# Patient Record
Sex: Male | Born: 1965 | Race: Black or African American | Hispanic: No | Marital: Married | Smoking: Never smoker
Health system: Southern US, Community
[De-identification: ages and names within clinical notes are randomized; demographics above are authoritative.]

## PROBLEM LIST (undated history)

## (undated) DIAGNOSIS — M109 Gout, unspecified: Secondary | ICD-10-CM

## (undated) DIAGNOSIS — K589 Irritable bowel syndrome without diarrhea: Secondary | ICD-10-CM

## (undated) DIAGNOSIS — K219 Gastro-esophageal reflux disease without esophagitis: Secondary | ICD-10-CM

## (undated) HISTORY — PX: SHOULDER ARTHROSCOPY: SHX128

---

## 2010-01-18 ENCOUNTER — Ambulatory Visit: Payer: Self-pay | Admitting: Internal Medicine

## 2010-01-22 ENCOUNTER — Ambulatory Visit: Payer: Self-pay | Admitting: Internal Medicine

## 2010-01-28 LAB — CONVERTED CEMR LAB
ALT: 25 units/L (ref 0–53)
AST: 25 units/L (ref 0–37)
Basophils Absolute: 0 10*3/uL (ref 0.0–0.1)
Calcium: 9.6 mg/dL (ref 8.4–10.5)
Eosinophils Relative: 1.7 % (ref 0.0–5.0)
GFR calc non Af Amer: 68.34 mL/min (ref >60)
Glucose, Bld: 92 mg/dL (ref 70–99)
HDL: 43.4 mg/dL (ref >39.00)
Lymphs Abs: 1.7 10*3/uL (ref 0.7–4.0)
MCV: 93.5 fL (ref 78.0–100.0)
Monocytes Absolute: 0.3 10*3/uL (ref 0.1–1.0)
Monocytes Relative: 5.6 % (ref 3.0–12.0)
Neutrophils Relative %: 56.3 % (ref 43.0–77.0)
PSA: 0.42 ng/mL (ref 0.10–4.00)
Platelets: 178 10*3/uL (ref 150.0–400.0)
Potassium: 4.2 meq/L (ref 3.5–5.1)
RDW: 13.1 % (ref 11.5–14.6)
Sodium: 141 meq/L (ref 135–145)
Total CHOL/HDL Ratio: 4
Triglycerides: 119 mg/dL (ref 0.0–149.0)
VLDL: 23.8 mg/dL (ref 0.0–40.0)
WBC: 4.7 10*3/uL (ref 4.5–10.5)

## 2010-08-24 NOTE — Assessment & Plan Note (Signed)
Summary: NEW TO ESTAB/CBS   Vital Signs:  Patient profile:   45 year old male Height:      73 inches Weight:      240 pounds BMI:     31.78 Temp:     98.1 degrees F oral Pulse rate:   66 / minute Resp:     16 per minute BP sitting:   140 / 90  (left arm)  Vitals Entered By: Jeremy Johann CMA (January 18, 2010 3:48 PM) CC: new to establish, Back Pain Comments -yearly --not fasting REVIEWED MED LIST, PATIENT AGREED DOSE AND INSTRUCTION CORRECT    History of Present Illness: NEW PT CPX no physical in 2 years    Preventive Screening-Counseling & Management  Alcohol-Tobacco     Smoking Status: never  Caffeine-Diet-Exercise     Does Patient Exercise: no      Drug Use:  no.    Allergies (verified): No Known Drug Allergies  Past History:  Past Medical History: h/o IBS, had a Cscope 2007 aprox  epididmal cyst , remotely (1990s)  Past Surgical History: none  Family History: DM-- no MI--no Colon ca-- no prostate ca--no Kawasaki dz? 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 CV:  Denies chest pain or discomfort, palpitations, and swelling of feet. Resp:  Denies cough and shortness of breath. GI:  Denies bloody stools, diarrhea, and nausea. GU:  Denies hematuria, urinary frequency, and urinary hesitancy. Psych:  Denies anxiety and depression.  Physical Exam  General:  alert, well-developed, and well-nourished.   Ears:  R ear normal and L ear normal.   Mouth:  good dentition and pharynx pink and moist.   Neck:  no masses, no thyromegaly, and normal carotid upstroke.   Lungs:  normal respiratory effort, no intercostal retractions, no accessory  muscle use, and normal breath sounds.   Heart:  normal rate, regular rhythm, and no murmur.   Abdomen:  soft, non-tender, no distention, no masses, no guarding, and no rigidity.   Rectal:  No external abnormalities noted. Normal sphincter tone. No rectal masses or tenderness. hemocult neg  Prostate:  Prostate gland firm and smooth, no enlargement, nodularity, tenderness, mass, asymmetry or induration. Extremities:  no pretibial edema bilaterally  Neurologic:  alert & oriented X3, strength normal in all extremities, and gait normal.   Psych:  Cognition and judgment appear intact. Alert and cooperative with normal attention span and concentration. not anxious appearing and not depressed appearing.     Impression & Recommendations:  Problem # 1:  ROUTINE GENERAL MEDICAL EXAM@HEALTH  CARE FACL (ICD-V70.0) Td 08 Cscope 2007 per patient  (w/u for IBS) labs pt is AA , start prostate ca screening q 2years, q year at age 39  diet exercise discussed   slightly  elevated BP, see instructions  Patient Instructions: 1)  came back fasting 2)  FLP CBC BMP PSA TSH AST ALT----dx V70  3)  Check your blood pressure 2 or 3 times a month . If it is more than 140/85 consistently,please let us know  4)  Please schedule a follow-up appointment in 1 year.    Tetanus/Td Immunization History:    Tetanus/Td # 1:  per pt  (  03/26/2007)   

## 2014-01-30 ENCOUNTER — Encounter: Payer: Self-pay | Admitting: Emergency Medicine

## 2014-01-30 ENCOUNTER — Emergency Department
Admission: EM | Admit: 2014-01-30 | Discharge: 2014-01-30 | Disposition: A | Discharge status: Home / Self Care | Payer: Managed Care, Other (non HMO) | Source: Home / Self Care | Attending: Emergency Medicine | Admitting: Emergency Medicine

## 2014-01-30 DIAGNOSIS — R509 Fever, unspecified: Secondary | ICD-10-CM

## 2014-01-30 DIAGNOSIS — H109 Unspecified conjunctivitis: Secondary | ICD-10-CM

## 2014-01-30 DIAGNOSIS — J069 Acute upper respiratory infection, unspecified: Secondary | ICD-10-CM

## 2014-01-30 HISTORY — DX: Gastro-esophageal reflux disease without esophagitis: K21.9

## 2014-01-30 HISTORY — DX: Irritable bowel syndrome, unspecified: K58.9

## 2014-01-30 LAB — POCT INFLUENZA A/B
INFLUENZA A, POC: NEGATIVE
INFLUENZA B, POC: NEGATIVE

## 2014-01-30 MED ORDER — POLYMYXIN B-TRIMETHOPRIM 10000-0.1 UNIT/ML-% OP SOLN: 1.0000 [drp] | Freq: Four times a day (QID) | OPHTHALMIC | Status: DC

## 2014-01-30 MED ORDER — AZITHROMYCIN 250 MG PO TABS: ORAL_TABLET | ORAL | Status: DC

## 2014-01-30 NOTE — ED Notes (Signed)
Eyes red, watery, burning, body aches, chills, sweats, cough, runny nose, congestion started Tuesday night worse yesterday and today

## 2014-01-30 NOTE — ED Provider Notes (Signed)
CSN: [id number]     Arrival date & time 01/30/14  1020 History   First MD Initiated Contact with Patient 01/30/14 1021     Chief Complaint  Patient presents with  . Eye Problem   (Consider location/radiation/quality/duration/timing/severity/associated sxs/prior Treatment) HPI Christopher Massey is a 48 y.o. male who complains of onset of cold symptoms for 4 days.  The symptoms are constant and mild-moderate in severity. + sore throat + cough No pleuritic pain No wheezing + nasal congestion + post-nasal drainage + sinus pain/pressure + chest congestion + Bilateral itchy/red eyes, with drainage that is clear.  Has been exposed to someone with pink eye recently. No hemoptysis No SOB + chills/sweats No nausea No vomiting + abdominal pain/bloating No diarrhea No skin rashes + fatigue + myalgias + headache     Past Medical History  Diagnosis Date  . IBS (irritable bowel syndrome)   . GERD (gastroesophageal reflux disease)    History reviewed. No pertinent past surgical history. No family history on file. History  Substance Use Topics  . Smoking status: Never Smoker   . Smokeless tobacco: Not on file  . Alcohol Use: No    Review of Systems  All other systems reviewed and are negative.   Allergies  Review of patient's allergies indicates no known allergies.  Home Medications   Prior to Admission medications   Medication Sig Start Date End Date Taking? Authorizing Provider  azithromycin (ZITHROMAX Z-PAK) 250 MG tablet Use as directed 01/30/14   Marlaine HindJeffrey H Modest Draeger, MD  trimethoprim-polymyxin b (POLYTRIM) ophthalmic solution Place 1 drop into both eyes every 6 (six) hours. 01/30/14   Marlaine HindJeffrey H Keilynn Marano, MD   BP 123/84  Pulse 74  Temp(Src) 98 F (36.7 C) (Oral)  Ht 6\' 2"  (1.88 m)  Wt 250 lb (113.399 kg)  BMI 32.08 kg/m2  SpO2 96% Physical Exam  Nursing note and vitals reviewed. Constitutional: He is oriented to person, place, and time. He appears well-developed and  well-nourished. He does not have a sickly appearance.  HENT:  Head: Normocephalic and atraumatic.  Right Ear: Tympanic membrane, external ear and ear canal normal.  Left Ear: Tympanic membrane, external ear and ear canal normal.  Nose: Mucosal edema and rhinorrhea present.  Mouth/Throat: Posterior oropharyngeal erythema present. No oropharyngeal exudate or posterior oropharyngeal edema.  Eyes: Pupils are equal, round, and reactive to light. Right eye exhibits no exudate. Left eye exhibits no exudate. Right conjunctiva is injected. Left conjunctiva is injected. No scleral icterus.  Neck: Neck supple.  Cardiovascular: Regular rhythm and normal heart sounds.   Pulmonary/Chest: Effort normal and breath sounds normal. No respiratory distress. He has no decreased breath sounds. He has no wheezes. He has no rhonchi.  Neurological: He is alert and oriented to person, place, and time.  Skin: Skin is warm and dry.  Psychiatric: He has a normal mood and affect. His speech is normal.    ED Course  Procedures (including critical care time) Labs Review Labs Reviewed  POCT INFLUENZA A/B    Imaging Review No results found.   MDM   1. Fever, unspecified   2. Acute upper respiratory infections of unspecified site   3. Conjunctivitis, unspecified laterality    1)  Take the prescribed antibiotic as instructed.  Rapid flu negative. 2)  Use nasal saline solution (over the counter) at least 3 times a day. 3)  Use over the counter decongestants like Zyrtec-D every 12 hours as needed to help with congestion.  If you have hypertension,  do not take medicines with sudafed.  4)  Can take tylenol every 6 hours or motrin every 8 hours for pain or fever. 5)  Follow up with your primary doctor if no improvement in 5-7 days, sooner if increasing pain, fever, or new symptoms.     Marlaine Hind, MD 01/30/14 1146

## 2014-01-31 ENCOUNTER — Telehealth: Payer: Self-pay | Admitting: Pulmonary Disease

## 2014-01-31 NOTE — Telephone Encounter (Signed)
lmomtcb x1 According to epic pt has never been seen here. He needs a consult appt

## 2014-02-03 NOTE — Telephone Encounter (Signed)
lmomtcb for pt 

## 2014-02-04 NOTE — Telephone Encounter (Signed)
Spoke with patient Disabled veteran needing 2nd opinion of sleep apnea for VA purposes Current CPAP use- compliant per patient --- followed by Dr Particia Lathersbourne Patient reports having Nasal Septum Deviation which has been followed by Dr Randa EvensEdwards. Per patient, he was informed that his deviated septum could be the cause of his sleep apnea issues.  [facility] at 2:15 Pt aware to arrive at 2pm to fill out paperwork Bring all meds to appt  .Nothing further needed.

## 2014-02-07 ENCOUNTER — Emergency Department
Admission: EM | Admit: 2014-02-07 | Discharge: 2014-02-07 | Disposition: A | Discharge status: Home / Self Care | Payer: Managed Care, Other (non HMO) | Source: Home / Self Care | Attending: Emergency Medicine | Admitting: Emergency Medicine

## 2014-02-07 ENCOUNTER — Encounter: Payer: Self-pay | Admitting: Emergency Medicine

## 2014-02-07 ENCOUNTER — Emergency Department (INDEPENDENT_AMBULATORY_CARE_PROVIDER_SITE_OTHER): Payer: Managed Care, Other (non HMO)

## 2014-02-07 DIAGNOSIS — J209 Acute bronchitis, unspecified: Secondary | ICD-10-CM

## 2014-02-07 DIAGNOSIS — R05 Cough: Secondary | ICD-10-CM

## 2014-02-07 DIAGNOSIS — J0101 Acute recurrent maxillary sinusitis: Secondary | ICD-10-CM

## 2014-02-07 DIAGNOSIS — R059 Cough, unspecified: Secondary | ICD-10-CM

## 2014-02-07 DIAGNOSIS — J0100 Acute maxillary sinusitis, unspecified: Secondary | ICD-10-CM

## 2014-02-07 MED ORDER — CEFDINIR 300 MG PO CAPS: 300.0000 mg | ORAL_CAPSULE | Freq: Two times a day (BID) | ORAL | Status: DC

## 2014-02-07 MED ORDER — PREDNISONE (PAK) 10 MG PO TABS: ORAL_TABLET | ORAL | Status: DC

## 2014-02-07 MED ORDER — IPRATROPIUM-ALBUTEROL 0.5-2.5 (3) MG/3ML IN SOLN
3.0000 mL | RESPIRATORY_TRACT | Status: AC
  Administered 2014-02-07: 3 mL via RESPIRATORY_TRACT

## 2014-02-07 MED ORDER — PROMETHAZINE-CODEINE 6.25-10 MG/5ML PO SYRP: ORAL_SOLUTION | ORAL | Status: DC

## 2014-02-07 NOTE — ED Provider Notes (Signed)
CSN: [id number]     Arrival date & time 02/07/14  1615 History   First MD Initiated Contact with Patient 02/07/14 1640     Chief Complaint  Patient presents with  . Nasal Congestion  . Cough   (Consider location/radiation/quality/duration/timing/severity/associated sxs/prior Treatment) HPI Was seen here 8 days ago on 01/30/14 for URI and he took Z-Pak as prescribed and completed. Reviewed those notes from 01/30/14.  Over the past 2-3 days, worsening symptoms of cough productive of discolored sputum, fever chills, sweats. 2 days ago had nausea and vomiting and diarrhea, 2 loose semi-formed stools, but since then vomiting and diarrhea have resolved. He has minimal nausea. He is tolerating by mouth's, but decreased appetite. Denies any blood or mucus in stool.   + chills/sweats +  Fever  +  Nasal congestion +  Discolored Post-nasal drainage No sinus pain/pressure No sore throat  +  cough + wheezing +chest congestion No hemoptysis No shortness of breath No pleuritic pain.  denies exertional chest pain  No itchy/red eyes Mild bilateral ear fullness   Mild nausea No current vomiting No abdominal pain No current diarrhea  No skin rashes +  Fatigue No myalgias No headache    Past Medical History  Diagnosis Date  . IBS (irritable bowel syndrome)   . GERD (gastroesophageal reflux disease)    History reviewed. No pertinent past surgical history. History reviewed. No pertinent family history. History  Substance Use Topics  . Smoking status: Never Smoker   . Smokeless tobacco: Not on file  . Alcohol Use: No    Review of Systems  All other systems reviewed and are negative.   Allergies  Review of patient's allergies indicates no known allergies.  Home Medications   Prior to Admission medications   Medication Sig Start Date End Date Taking? Authorizing Provider  cefdinir (OMNICEF) 300 MG capsule Take 1 capsule (300 mg total) by mouth 2 (two) times daily. X 10 days  02/07/14   Lajean Manes, MD  predniSONE (STERAPRED UNI-PAK) 10 MG tablet Take as directed for 6 days.--Take 6 on day 1, 5 on day 2, 4 on day 3, then 3 tablets on day 4, then 2 tablets on day 5, then 1 on day 6. 02/07/14   Lajean Manes, MD  promethazine-codeine [facility] WITH CODEINE) 6.25-10 MG/5ML syrup Take 1-2 teaspoons every 4-6 hours as needed for cough. May cause drowsiness. 02/07/14   Lajean Manes, MD   BP 132/86  Pulse 80  Temp(Src) 98.3 F (36.8 C) (Oral)  Resp 16  SpO2 96% Physical Exam  Nursing note and vitals reviewed. Constitutional: He is oriented to person, place, and time. He appears well-developed and well-nourished. No distress.  Appears fatigued, ill, but no acute cardiorespiratory distress. Alert, cooperative  HENT:  Head: Normocephalic and atraumatic.  Right Ear: Tympanic membrane, external ear and ear canal normal.  Left Ear: Tympanic membrane, external ear and ear canal normal.  Nose: Mucosal edema and rhinorrhea present. Right sinus exhibits maxillary sinus tenderness. Left sinus exhibits maxillary sinus tenderness.  Mouth/Throat: Oropharynx is clear and moist. No oral lesions. No oropharyngeal exudate.  Eyes: Conjunctivae are normal. Right eye exhibits no discharge. Left eye exhibits no discharge. No scleral icterus.  Neck: Neck supple.  Cardiovascular: Normal rate, regular rhythm and normal heart sounds.  Exam reveals no gallop and no friction rub.   No murmur heard. Pulmonary/Chest: Effort normal. He has wheezes. He has rhonchi.  Diffuse rhonchi. Mild late expiratory wheezes bilaterally. Bibasilar rales which clear after  coughing.  Abdominal: Soft. There is no tenderness.  Musculoskeletal: Normal range of motion. He exhibits no edema and no tenderness.  Lymphadenopathy:    He has no cervical adenopathy.  Neurological: He is alert and oriented to person, place, and time. No cranial nerve deficit.  Skin: Skin is warm and dry. No rash noted. He is not  diaphoretic.  Psychiatric: He has a normal mood and affect.    ED Course  Procedures (including critical care time) Labs Review Labs Reviewed - No data to display  Imaging Review Dg Chest 2 View  02/07/2014   CLINICAL DATA:  Two-day history of cough  EXAM: CHEST  2 VIEW  COMPARISON:  None.  FINDINGS: The lungs are well-expanded and clear. The heart and mediastinal structures are normal. There is no pleural effusion. The bony thorax is unremarkable.  IMPRESSION: There is no acute cardiopulmonary abnormality.   Electronically Signed   By: David  Swaziland[place]   On: 02/07/2014 17:11     MDM   1. Acute bronchitis with bronchospasm   2. Acute recurrent maxillary sinusitis    Treatment options discussed, as well as risks, benefits, alternatives. Patient voiced understanding and agreement with the following plans:  Chest x-ray done today, shows no acute abnormality. No infiltrate. DuoNeb nebulizer treatment given. Wheezing improved somewhat.  He declined IM Rocephin or IM steroids at this time. Omnicef prescribed for antibiotic coverage.  He declined Augmentin because of possible GI upset and history of IBS.  Phenergan with codeine cough syrup as needed for severe cough, but precautions discussed. Prednisone 10 mg-6 day Dosepak  Follow-up with your primary care doctor in 5-7 days if not improving, or sooner if symptoms become worse. Precautions discussed. Red flags discussed. Questions invited and answered. Patient voiced understanding and agreement.  Lajean Manesavid Massey, MD 02/07/14 [phone]

## 2014-02-07 NOTE — ED Notes (Signed)
Reports no improvement of congestion and cough from original evaluation 01/30/2014.

## 2014-03-11 ENCOUNTER — Institutional Professional Consult (permissible substitution): Payer: Managed Care, Other (non HMO) | Admitting: Pulmonary Disease

## 2015-08-23 ENCOUNTER — Encounter: Payer: Self-pay | Admitting: Emergency Medicine

## 2015-08-23 ENCOUNTER — Emergency Department (INDEPENDENT_AMBULATORY_CARE_PROVIDER_SITE_OTHER): Payer: Managed Care, Other (non HMO)

## 2015-08-23 ENCOUNTER — Emergency Department
Admission: EM | Admit: 2015-08-23 | Discharge: 2015-08-23 | Disposition: A | Discharge status: Home / Self Care | Payer: Managed Care, Other (non HMO) | Source: Home / Self Care | Attending: Family Medicine | Admitting: Family Medicine

## 2015-08-23 DIAGNOSIS — R221 Localized swelling, mass and lump, neck: Secondary | ICD-10-CM

## 2015-08-23 NOTE — ED Notes (Signed)
Patient presents to [facility] with C/O throat irritation times two days he feels as if something is lodged in the throat he denies problems eating or drinking.

## 2015-08-23 NOTE — ED Provider Notes (Signed)
CSN: [id number]     Arrival date & time 08/23/15  1128 History   First MD Initiated Contact with Patient 08/23/15 1136     Chief Complaint  Patient presents with  . Sore Throat   (Consider location/radiation/quality/duration/timing/severity/associated sxs/prior Treatment) HPI Pt is a 50yo male presenting to [facility] with c/o throat irritation for about 2 days. Pt states it feels like a lump is in his throat but denies difficulty breathing or swallowing. He also denies throat pain. He has been able to eat and drink but the sensation remains. He has hx of acid reflux but only takes medication as needed and has not felt any acid reflux recently. Denies fever, chills, n/v/d. Denies cough or congestion.   Past Medical History  Diagnosis Date  . IBS (irritable bowel syndrome)   . GERD (gastroesophageal reflux disease)    History reviewed. No pertinent past surgical history. History reviewed. No pertinent family history. Social History  Substance Use Topics  . Smoking status: Never Smoker   . Smokeless tobacco: None  . Alcohol Use: No    Review of Systems  Constitutional: Negative for fever and chills.  HENT: Positive for sore throat ( "sensation of something lodged in throat."). Negative for congestion, ear pain, trouble swallowing and voice change.   Respiratory: Negative for cough and shortness of breath.   Cardiovascular: Negative for chest pain and palpitations.  Gastrointestinal: Negative for nausea, vomiting, abdominal pain and diarrhea.  Musculoskeletal: Negative for myalgias, back pain and arthralgias.  Skin: Negative for rash.    Allergies  Review of patient's allergies indicates no known allergies.  Home Medications   Prior to Admission medications   Medication Sig Start Date End Date Taking? Authorizing Provider  cefdinir (OMNICEF) 300 MG capsule Take 1 capsule (300 mg total) by mouth 2 (two) times daily. X 10 days 02/07/14   Lajean Manes, MD  predniSONE (STERAPRED UNI-PAK)  10 MG tablet Take as directed for 6 days.--Take 6 on day 1, 5 on day 2, 4 on day 3, then 3 tablets on day 4, then 2 tablets on day 5, then 1 on day 6. 02/07/14   Lajean Manes, MD  promethazine-codeine [facility] WITH CODEINE) 6.25-10 MG/5ML syrup Take 1-2 teaspoons every 4-6 hours as needed for cough. May cause drowsiness. 02/07/14   Lajean Manes, MD   Meds Ordered and Administered this Visit  Medications - No data to display  Pulse 75  Temp(Src) 98 F (36.7 C) (Oral)  Resp 16  Ht  (1.854 m)  Wt 251 lb (113.853 kg)  BMI 33.12 kg/m2  SpO2 96% No data found.   Physical Exam  Constitutional: He appears well-developed and well-nourished.  HENT:  Head: Normocephalic and atraumatic.  Right Ear: Hearing, tympanic membrane, external ear and ear canal normal.  Left Ear: Hearing, tympanic membrane, external ear and ear canal normal.  Nose: Nose normal.  Mouth/Throat: Uvula is midline, oropharynx is clear and moist and mucous membranes are normal.  Eyes: Conjunctivae are normal. No scleral icterus.  Neck: Normal range of motion. Neck supple. No JVD present. No tracheal deviation present. No thyromegaly present.  Cardiovascular: Normal rate, regular rhythm and normal heart sounds.   Pulmonary/Chest: Effort normal and breath sounds normal. No stridor. No respiratory distress. He has no wheezes. He has no rales. He exhibits no tenderness.  Abdominal: Soft. He exhibits no distension. There is no tenderness.  Musculoskeletal: Normal range of motion.  Lymphadenopathy:    He has no cervical adenopathy.  Neurological:  He is alert.  Skin: Skin is warm and dry.  Nursing note and vitals reviewed.   ED Course  Procedures (including critical care time)  Labs Review Labs Reviewed - No data to display  Imaging Review Dg Neck Soft Tissue  08/23/2015  CLINICAL DATA:  Sore throat for 2 days, possible retained foreign body EXAM: NECK SOFT TISSUES - 1+ VIEW COMPARISON:  None. FINDINGS: There is no  evidence of retropharyngeal soft tissue swelling or epiglottic enlargement. The cervical airway is unremarkable and no radio-opaque foreign body identified. Mild degenerative change of the cervical spine is seen. IMPRESSION: No acute abnormality noted. Electronically Signed   By: Alcide Clever M.D.   On: 08/23/2015 12:19       MDM   1. Sensation of lump in throat    Pt c/o sensation of something in his throat but denies difficulty breathing or swallowing. Normal exam.   Plain films: no acute abnormality noted.  Consulted with radiology about possible mass, likely shadowing based on pt's positioning.   Recommends endoscopy as CT scan would likely not be as beneficial w/o specific diagnosis or physical exam findings.  Reassured pt of normal imaging. Encouraged to f/u with PCP this week to discuss further evaluation and possible laryngoscopy/endoscopy. Patient verbalized understanding and agreement with treatment plan.    Junius Finner, PA-C 08/23/15 [phone]

## 2017-07-30 ENCOUNTER — Encounter (HOSPITAL_BASED_OUTPATIENT_CLINIC_OR_DEPARTMENT_OTHER): Payer: Self-pay | Admitting: Emergency Medicine

## 2017-07-30 ENCOUNTER — Emergency Department (HOSPITAL_BASED_OUTPATIENT_CLINIC_OR_DEPARTMENT_OTHER)
Admission: EM | Admit: 2017-07-30 | Discharge: 2017-07-30 | Disposition: A | Discharge status: Home / Self Care | Payer: Commercial Managed Care - PPO | Attending: Emergency Medicine | Admitting: Emergency Medicine

## 2017-07-30 ENCOUNTER — Other Ambulatory Visit: Payer: Self-pay

## 2017-07-30 DIAGNOSIS — Z79899 Other long term (current) drug therapy: Secondary | ICD-10-CM | POA: Diagnosis not present

## 2017-07-30 DIAGNOSIS — R509 Fever, unspecified: Secondary | ICD-10-CM | POA: Diagnosis not present

## 2017-07-30 DIAGNOSIS — R69 Illness, unspecified: Secondary | ICD-10-CM

## 2017-07-30 DIAGNOSIS — R51 Headache: Secondary | ICD-10-CM | POA: Insufficient documentation

## 2017-07-30 DIAGNOSIS — M791 Myalgia, unspecified site: Secondary | ICD-10-CM | POA: Insufficient documentation

## 2017-07-30 DIAGNOSIS — J111 Influenza due to unidentified influenza virus with other respiratory manifestations: Secondary | ICD-10-CM

## 2017-07-30 HISTORY — DX: Gout, unspecified: M10.9

## 2017-07-30 MED ORDER — NAPROXEN 250 MG PO TABS
500.0000 mg | ORAL_TABLET | Freq: Once | ORAL | Status: AC
  Administered 2017-07-30: 500 mg via ORAL
  Filled 2017-07-30: qty 2

## 2017-07-30 MED ORDER — ACETAMINOPHEN ER 650 MG PO TBCR: 650.0000 mg | EXTENDED_RELEASE_TABLET | Freq: Three times a day (TID) | ORAL | 0 refills | Status: DC | PRN

## 2017-07-30 MED ORDER — IBUPROFEN 600 MG PO TABS: 600.0000 mg | ORAL_TABLET | Freq: Four times a day (QID) | ORAL | 0 refills | Status: DC | PRN

## 2017-07-30 NOTE — ED Provider Notes (Signed)
[facility] EMERGENCY DEPARTMENT Provider Note   CSN: [id number] Arrival date & time: 07/30/17  [id number]     History   Chief Complaint Chief Complaint  Patient presents with  . Flu like symptoms    HPI Christopher Massey is a 52 y.o. male.  HPI 52 year old male with history of IBS, gout comes in with chief complaint of flulike illness.  Patient started having upper respiratory infection-like symptoms about a week ago.  Over time patient developed more malaise, chills, body aches, headaches and fevers. Patient was seen by his primary care doctor, and started on Tamiflu, however, pt started having nausea and emesis and therefore Tamiflu was discontinued.  Patient was feeling a little better on Friday, however his symptoms started getting worse again yesterday.  Patient woke up today feeling significantly weak, with chills and body aches therefore decided to come to the ER.  Review of system is positive for moderate headache.  There is no associated neck stiffness or neck pain, or any confusion, focal neurologic symptoms, seizures.   Past Medical History:  Diagnosis Date  . GERD (gastroesophageal reflux disease)   . Gout   . IBS (irritable bowel syndrome)     There are no active problems to display for this patient.   Past Surgical History:  Procedure Laterality Date  . SHOULDER ARTHROSCOPY Bilateral        Home Medications    Prior to Admission medications   Medication Sig Start Date End Date Taking? Authorizing Provider  omeprazole (PRILOSEC) 20 MG capsule Take 20 mg by mouth daily.   Yes [provider]  acetaminophen (TYLENOL 8 HOUR) 650 MG CR tablet Take 1 tablet (650 mg total) by mouth every 8 (eight) hours as needed for pain. 07/30/17   Derwood KaplanNanavati, Romona Murdy, MD  cefdinir (OMNICEF) 300 MG capsule Take 1 capsule (300 mg total) by mouth 2 (two) times daily. X 10 days 02/07/14   Lajean ManesMassey, David, MD  ibuprofen (ADVIL,MOTRIN) 600 MG tablet Take 1 tablet (600 mg total) by  mouth every 6 (six) hours as needed. 07/30/17   Derwood KaplanNanavati, Kayleeann Huxford, MD  predniSONE (STERAPRED UNI-PAK) 10 MG tablet Take as directed for 6 days.--Take 6 on day 1, 5 on day 2, 4 on day 3, then 3 tablets on day 4, then 2 tablets on day 5, then 1 on day 6. 02/07/14   Lajean ManesMassey, David, MD  promethazine-codeine [facility](PHENERGAN WITH CODEINE) 6.25-10 MG/5ML syrup Take 1-2 teaspoons every 4-6 hours as needed for cough. May cause drowsiness. 02/07/14   Lajean ManesMassey, David, MD    Family History No family history on file.  Social History Social History   Tobacco Use  . Smoking status: Never Smoker  . Smokeless tobacco: Never Used  Substance Use Topics  . Alcohol use: No  . Drug use: Not on file     Allergies   Patient has no known allergies.   Review of Systems Review of Systems  Constitutional: Positive for activity change.  HENT: Positive for congestion, rhinorrhea and sore throat. Negative for trouble swallowing.   Eyes: Negative for photophobia and visual disturbance.  Respiratory: Negative for shortness of breath.   Cardiovascular: Negative for chest pain.  Gastrointestinal: Negative for vomiting.  Musculoskeletal: Positive for myalgias.  Allergic/Immunologic: Negative for immunocompromised state.  All other systems reviewed and are negative.    Physical Exam Updated Vital Signs BP (!) 153/102 (BP Location: Left Arm)   Pulse 78   Temp 99.6 F (37.6 C) (Oral)   Resp 20  Ht 6\' 1"  (1.854 m)   Wt 110.2 kg (243 lb)   SpO2 99%   BMI 32.06 kg/m   Physical Exam  Constitutional: He is oriented to person, place, and time. He appears well-developed.  HENT:  Head: Atraumatic.  Mouth/Throat: Oropharynx is clear and moist. No oropharyngeal exudate.  Eyes: EOM are normal. Pupils are equal, round, and reactive to light. No scleral icterus.  Neck: Neck supple.  No meningismus  Cardiovascular: Normal rate.  Pulmonary/Chest: Effort normal. No respiratory distress. He has no wheezes.  Lymphadenopathy:     He has cervical adenopathy.  Neurological: He is alert and oriented to person, place, and time.  Skin: Skin is warm.  Nursing note and vitals reviewed.    ED Treatments / Results  Labs (all labs ordered are listed, but only abnormal results are displayed) Labs Reviewed - No data to display  EKG  EKG Interpretation None       Radiology No results found.  Procedures Procedures (including critical care time)  Medications Ordered in ED Medications  naproxen (NAPROSYN) tablet 500 mg (not administered)     Initial Impression / Assessment and Plan / ED Course  I have reviewed the triage vital signs and the nursing notes.  Pertinent labs & imaging results that were available during my care of the patient were reviewed by me and considered in my medical decision making (see chart for details).    52 year old healthy male comes in with chief complaint of upper respiratory infection-like symptoms, with associated body aches, chills, malaise.  Patient had a recent negative flu test.  He reports that his symptoms have been waxing and waning, this morning they have been the worst.  On my exam there is no focal finding.  Specifically the lung exam is normal, neurologic exam is normal, there is no nuchal rigidity, and patient does not appear toxic.  It appears that patient has reduced his p.o. intake, we advised him that he needs to push fluids while he is recovering from his illness.  Patient has been taking ibuprofen as needed, we have asked him to now take ibuprofen every 6 hours, with Tylenol in between.  It appears that when patient takes ibuprofen, his pain gets better and is overall energy level also goes up, which is reassuring.  We will treat patient has influenza-like illness, with symptomatic relief.  Strict return precautions have been discussed.  Final Clinical Impressions(s) / ED Diagnoses   Final diagnoses:  Influenza-like illness    ED Discharge Orders         Ordered    ibuprofen (ADVIL,MOTRIN) 600 MG tablet  Every 6 hours PRN     07/30/17 1112    acetaminophen (TYLENOL 8 HOUR) 650 MG CR tablet  Every 8 hours PRN     07/30/17 1112       Derwood Kaplan, MD 07/30/17 1129

## 2017-07-30 NOTE — Discharge Instructions (Signed)
We think what you have is a viral syndrome - the treatment for which is symptomatic relief only, and your body will fight the infection off in a few days. We are prescribing you some meds for pain and fevers. Please make sure that you hydrate well. See your primary care doctor in 1 week if the symptoms dont improve.  Please return to the ER if your symptoms worsen; there is inability to keep any medications down, confusion, severe headaches etc.. Otherwise see the outpatient doctor as requested.

## 2017-07-30 NOTE — ED Triage Notes (Signed)
Pt with flu like symptoms x 1 week. Seen by PCP and given tamiflu and tessalon. Pt states tamiflu made him sick to his stomach. Pt feeling worse today.

## 2021-03-02 ENCOUNTER — Emergency Department (HOSPITAL_BASED_OUTPATIENT_CLINIC_OR_DEPARTMENT_OTHER): Payer: Managed Care, Other (non HMO)

## 2021-03-02 ENCOUNTER — Emergency Department (HOSPITAL_BASED_OUTPATIENT_CLINIC_OR_DEPARTMENT_OTHER)
Admission: EM | Admit: 2021-03-02 | Discharge: 2021-03-02 | Disposition: A | Discharge status: Home / Self Care | Payer: Managed Care, Other (non HMO) | Attending: Emergency Medicine | Admitting: Emergency Medicine

## 2021-03-02 ENCOUNTER — Other Ambulatory Visit: Payer: Self-pay

## 2021-03-02 ENCOUNTER — Encounter (HOSPITAL_BASED_OUTPATIENT_CLINIC_OR_DEPARTMENT_OTHER): Payer: Self-pay | Admitting: *Deleted

## 2021-03-02 DIAGNOSIS — R1032 Left lower quadrant pain: Secondary | ICD-10-CM

## 2021-03-02 DIAGNOSIS — R1110 Vomiting, unspecified: Secondary | ICD-10-CM | POA: Diagnosis not present

## 2021-03-02 DIAGNOSIS — R197 Diarrhea, unspecified: Secondary | ICD-10-CM | POA: Diagnosis not present

## 2021-03-02 LAB — COMPREHENSIVE METABOLIC PANEL
ALT: 25 U/L (ref 0–44)
AST: 22 U/L (ref 15–41)
Albumin: 4.9 g/dL (ref 3.5–5.0)
Alkaline Phosphatase: 54 U/L (ref 38–126)
Anion gap: 10 (ref 5–15)
BUN: 12 mg/dL (ref 6–20)
CO2: 30 mmol/L (ref 22–32)
Calcium: 9.9 mg/dL (ref 8.9–10.3)
Chloride: 98 mmol/L (ref 98–111)
Creatinine, Ser: 1.13 mg/dL (ref 0.61–1.24)
GFR, Estimated: >60 mL/min (ref >60)
Glucose, Bld: 101 mg/dL — ABNORMAL HIGH (ref 70–99)
Potassium: 4 mmol/L (ref 3.5–5.1)
Sodium: 138 mmol/L (ref 135–145)
Total Bilirubin: 0.7 mg/dL (ref 0.3–1.2)
Total Protein: 8.3 g/dL — ABNORMAL HIGH (ref 6.5–8.1)

## 2021-03-02 LAB — URINALYSIS, ROUTINE W REFLEX MICROSCOPIC
Bilirubin Urine: NEGATIVE
Glucose, UA: NEGATIVE mg/dL
Ketones, ur: NEGATIVE mg/dL
Leukocytes,Ua: NEGATIVE
Nitrite: NEGATIVE
Protein, ur: NEGATIVE mg/dL
Specific Gravity, Urine: 1.025 (ref 1.005–1.030)
pH: 6 (ref 5.0–8.0)

## 2021-03-02 LAB — URINALYSIS, MICROSCOPIC (REFLEX)

## 2021-03-02 LAB — LIPASE, BLOOD: Lipase: 32 U/L (ref 11–51)

## 2021-03-02 LAB — CBC
HCT: 52.2 % — ABNORMAL HIGH (ref 39.0–52.0)
Hemoglobin: 17.7 g/dL — ABNORMAL HIGH (ref 13.0–17.0)
MCH: 30.8 pg (ref 26.0–34.0)
MCHC: 33.9 g/dL (ref 30.0–36.0)
MCV: 90.9 fL (ref 80.0–100.0)
Platelets: 199 10*3/uL (ref 150–400)
RBC: 5.74 MIL/uL (ref 4.22–5.81)
RDW: 13.6 % (ref 11.5–15.5)
WBC: 8.1 10*3/uL (ref 4.0–10.5)
nRBC: 0 % (ref 0.0–0.2)

## 2021-03-02 MED ORDER — ONDANSETRON HCL 4 MG/2ML IJ SOLN
4.0000 mg | Freq: Once | INTRAMUSCULAR | Status: AC
  Administered 2021-03-02: 4 mg via INTRAVENOUS
  Filled 2021-03-02: qty 2

## 2021-03-02 MED ORDER — IOHEXOL 300 MG/ML  SOLN
100.0000 mL | Freq: Once | INTRAMUSCULAR | Status: AC | PRN
  Administered 2021-03-02: 100 mL via INTRAVENOUS

## 2021-03-02 MED ORDER — MORPHINE SULFATE (PF) 4 MG/ML IV SOLN
4.0000 mg | Freq: Once | INTRAVENOUS | Status: AC
  Administered 2021-03-02: 4 mg via INTRAVENOUS
  Filled 2021-03-02: qty 1

## 2021-03-02 MED ORDER — HYDROCODONE-ACETAMINOPHEN 5-325 MG PO TABS: 1.0000 | ORAL_TABLET | ORAL | 0 refills | Status: DC | PRN

## 2021-03-02 MED ORDER — SODIUM CHLORIDE 0.9 % IV BOLUS
1000.0000 mL | Freq: Once | INTRAVENOUS | Status: AC
  Administered 2021-03-02: 1000 mL via INTRAVENOUS

## 2021-03-02 MED ORDER — ONDANSETRON 4 MG PO TBDP: 4.0000 mg | ORAL_TABLET | Freq: Three times a day (TID) | ORAL | 0 refills | Status: DC | PRN

## 2021-03-02 NOTE — ED Provider Notes (Signed)
[facility] EMERGENCY DEPARTMENT Provider Note   CSN: [id number] Arrival date & time: 03/02/21  1627     History No chief complaint on file.   Christopher Massey is a 55 y.o. male.  Christopher Massey is a 55 yo male with a PMH of IBS who presents today with abdominal pain, vomiting, and diarrhea. He states he woke up at 3am to urinate, then ate cake and ice cream and tried to fall back asleep. He then had sudden onset of sharp and cramping abdominal pain located across his entire abdomen. He had 3 episodes of diarrhea, along with some vomiting. He noted no blood in his stool at this time. He felt better until around 9am, when he had more cramping and then had multiple episodes of bowel movements where he passed "globs" with no fecal matter, although he did note some blood in these. He has been attempting to drink pedialyte, but has been unable to keep down solid foods and most liquids secondary to abdominal pain. He denies fevers. He notes that he has had previous episodes like this in 1998 and 2006, which led to him having an IBD workup (with colonoscopy and other imaging studies). He denies recent travel, antibiotic use, or contact with sick persons.       Past Medical History:  Diagnosis Date   GERD (gastroesophageal reflux disease)    Gout    IBS (irritable bowel syndrome)     There are no problems to display for this patient.   Past Surgical History:  Procedure Laterality Date   SHOULDER ARTHROSCOPY Bilateral        No family history on file.  Social History   Tobacco Use   Smoking status: Never   Smokeless tobacco: Never  Vaping Use   Vaping Use: Never used  Substance Use Topics   Alcohol use: No   Drug use: Never    Home Medications Prior to Admission medications   Medication Sig Start Date End Date Taking? Authorizing Provider  HYDROcodone-acetaminophen (NORCO/VICODIN) 5-325 MG tablet Take 1 tablet by mouth every 4 (four) hours as needed. 03/02/21  Yes Jeannie Fend, PA-C  ondansetron (ZOFRAN ODT) 4 MG disintegrating tablet Take 1 tablet (4 mg total) by mouth every 8 (eight) hours as needed for nausea or vomiting. 03/02/21  Yes Jeannie Fend, PA-C  acetaminophen (TYLENOL 8 HOUR) 650 MG CR tablet Take 1 tablet (650 mg total) by mouth every 8 (eight) hours as needed for pain. 07/30/17   Derwood Kaplan, MD  allopurinol (ZYLOPRIM) 100 MG tablet Take by mouth.    [provider]  cefdinir (OMNICEF) 300 MG capsule Take 1 capsule (300 mg total) by mouth 2 (two) times daily. X 10 days 02/07/14   Lajean Manes, MD  colchicine 0.6 MG tablet Take 2 tablets by mouth once today then 1 tablet by mouth 1 hour later, then take 1 tablet by mouth 3 times daily for 10 days. 05/20/19   [provider]  ibuprofen (ADVIL,MOTRIN) 600 MG tablet Take 1 tablet (600 mg total) by mouth every 6 (six) hours as needed. 07/30/17   Derwood Kaplan, MD  omeprazole (PRILOSEC) 20 MG capsule Take 20 mg by mouth daily.    [provider]  omeprazole (PRILOSEC) 20 MG capsule Take 2 capsules by mouth daily. 12/13/11   [provider]  predniSONE (STERAPRED UNI-PAK) 10 MG tablet Take as directed for 6 days.--Take 6 on day 1, 5 on day 2, 4 on day 3,  then 3 tablets on day 4, then 2 tablets on day 5, then 1 on day 6. 02/07/14   Lajean Manes, MD  promethazine-codeine [facility] WITH CODEINE) 6.25-10 MG/5ML syrup Take 1-2 teaspoons every 4-6 hours as needed for cough. May cause drowsiness. 02/07/14   Lajean Manes, MD    Allergies    Patient has no known allergies.  Review of Systems   Review of Systems  Constitutional:  Negative for chills, diaphoresis and fever.  Respiratory:  Negative for shortness of breath.   Cardiovascular:  Negative for chest pain.  Gastrointestinal:  Positive for abdominal pain, blood in stool and diarrhea.  Genitourinary:  Negative for dysuria and frequency.  Musculoskeletal:  Negative for arthralgias and myalgias.  Skin:  Negative for  rash and wound.  Allergic/Immunologic: Negative for immunocompromised state.  Neurological:  Negative for weakness and light-headedness.  Hematological:  Does not bruise/bleed easily.  Psychiatric/Behavioral:  Negative for confusion.   All other systems reviewed and are negative.  Physical Exam Updated Vital Signs BP (!) 157/97   Pulse 74   Temp 98.3 F (36.8 C) (Oral)   Resp 16   Ht 6\' 1"  (1.854 m)   Wt 109.8 kg   SpO2 100%   BMI 31.94 kg/m   Physical Exam Vitals and nursing note reviewed.  Constitutional:      General: He is not in acute distress.    Appearance: He is well-developed. He is not diaphoretic.  HENT:     Head: Normocephalic and atraumatic.  Cardiovascular:     Rate and Rhythm: Normal rate and regular rhythm.     Heart sounds: Normal heart sounds.  Pulmonary:     Effort: Pulmonary effort is normal.  Abdominal:     Palpations: Abdomen is soft.     Tenderness: There is abdominal tenderness in the left lower quadrant.  Skin:    General: Skin is warm and dry.     Findings: No erythema or rash.  Neurological:     Mental Status: He is alert and oriented to person, place, and time.  Psychiatric:        Behavior: Behavior normal.    ED Results / Procedures / Treatments   Labs (all labs ordered are listed, but only abnormal results are displayed) Labs Reviewed  COMPREHENSIVE METABOLIC PANEL - Abnormal; Notable for the following components:      Result Value   Glucose, Bld 101 (*)    Total Protein 8.3 (*)    All other components within normal limits  CBC - Abnormal; Notable for the following components:   Hemoglobin 17.7 (*)    HCT 52.2 (*)    All other components within normal limits  URINALYSIS, ROUTINE W REFLEX MICROSCOPIC - Abnormal; Notable for the following components:   Hgb urine dipstick TRACE (*)    All other components within normal limits  URINALYSIS, MICROSCOPIC (REFLEX) - Abnormal; Notable for the following components:   Bacteria, UA FEW  (*)    All other components within normal limits  LIPASE, BLOOD    EKG None  Radiology CT Abdomen Pelvis W Contrast  Result Date: 03/02/2021 CLINICAL DATA:  Abdomen pain EXAM: CT ABDOMEN AND PELVIS WITH CONTRAST TECHNIQUE: Multidetector CT imaging of the abdomen and pelvis was performed using the standard protocol following bolus administration of intravenous contrast. CONTRAST:  05/02/2021 OMNIPAQUE IOHEXOL 300 MG/ML  SOLN COMPARISON:  None. FINDINGS: Lower chest: Lung bases demonstrate atelectasis at the bases. No acute consolidation. Normal cardiomediastinal silhouette. Hepatobiliary: No focal  liver abnormality is seen. No gallstones, gallbladder wall thickening, or biliary dilatation. Pancreas: Unremarkable. No pancreatic ductal dilatation or surrounding inflammatory changes. Spleen: Normal in size without focal abnormality. Adrenals/Urinary Tract: Adrenal glands are normal. No hydronephrosis. Punctate nonobstructing stone lower pole left kidney. The bladder is unremarkable. Stomach/Bowel: Stomach is within normal limits. Appendix appears normal. No evidence of bowel wall thickening, distention, or inflammatory changes. Vascular/Lymphatic: No significant vascular findings are present. No enlarged abdominal or pelvic lymph nodes. Reproductive: Enlarged prostate Other: Negative for pelvic effusion or free air Musculoskeletal: No acute or significant osseous findings. IMPRESSION: 1. No CT evidence for acute intra-abdominal or pelvic abnormality. 2. Punctate nonobstructing stone in the left kidney. Electronically Signed   By: Jasmine Pang M.D.   On: 03/02/2021 20:45    Procedures Procedures   Medications Ordered in ED Medications  sodium chloride 0.9 % bolus 1,000 mL (1,000 mLs Intravenous New Bag/Given 03/02/21 2003)  ondansetron [facility]) injection 4 mg (4 mg Intravenous Given 03/02/21 2007)  morphine 4 MG/ML injection 4 mg (4 mg Intravenous Given 03/02/21 2008)  iohexol (OMNIPAQUE) 300 MG/ML solution 100  mL (100 mLs Intravenous Contrast Given 03/02/21 2023)    ED Course  I have reviewed the triage vital signs and the nursing notes.  Pertinent labs & imaging results that were available during my care of the patient were reviewed by me and considered in my medical decision making (see chart for details).  Clinical Course as of 03/02/21 2117  Tue Mar 02, 2021  [id number] 55 year old male with complaint of left lower quadrant abdominal pain with loose bloody stools and vomiting. Found to have tenderness in the left lower quadrant. CT abdomen pelvis with contrast shows a punctate renal stone otherwise unremarkable.  Review of labs, CBC with normal white blood cell count, hemoglobin 17.7.  CMP without significant changes, BNP normal at 12, normal creatinine at 1.13.  Lipase within normal notes.  Urinalysis with trace hemoglobin, no evidence of infection. Blood pressure is mildly elevated otherwise vitals reassuring, O2 sat 100% on room air.  Patient was given IV fluids, morphine, Zofran with improvement in his pain.  Discussed results with patient and wife, plan is for follow-up with GI, referral given.  Given prescription for Norco and Zofran with return to ER precautions. [LM]    Clinical Course User Index [LM] Alden Hipp   MDM Rules/Calculators/A&P                           Final Clinical Impression(s) / ED Diagnoses Final diagnoses:  Left lower quadrant abdominal pain  Diarrhea, unspecified type    Rx / DC Orders ED Discharge Orders          Ordered    HYDROcodone-acetaminophen (NORCO/VICODIN) 5-325 MG tablet  Every 4 hours PRN        03/02/21 2115    ondansetron (ZOFRAN ODT) 4 MG disintegrating tablet  Every 8 hours PRN        03/02/21 2115             Jeannie Fend, PA-C 03/02/21 2117    Jacalyn Lefevre, MD 03/02/21 2304

## 2021-03-02 NOTE — Discharge Instructions (Addendum)
Follow-up with GI, call in the morning to schedule an appointment.  Return to the emergency room for worsening or concerning symptoms. Take Norco as needed as prescribed for pain.  Take Zofran as needed as prescribed for nausea and vomiting.

## 2021-03-02 NOTE — ED Triage Notes (Signed)
Abdominal pain woke his at 3am. He had 4 BM's within an hour. Hx of IBS. After getting to work he started having abdominal cramps followed by bright red rectal bleeding several times.

## 2021-11-10 IMAGING — CT CT ABD-PELV W/ CM
2 of 5 series · 16 of 46 positions shown, 18 images · IV contrast (Omnipaque)
Comparison: None.

CLINICAL DATA: Abdomen pain

EXAM:
CT ABDOMEN AND PELVIS WITH CONTRAST
TECHNIQUE: Multidetector CT imaging of the abdomen and pelvis was performed
using the standard protocol following bolus administration of
intravenous contrast.
CONTRAST:  100mL OMNIPAQUE IOHEXOL 300 MG/ML  SOLN

[Series 2: axial st · axial · 0.97mm/px · z∈[-586,-141]mm · 13 of 99 slices shown, 15 images]
[im 5/99  soft-tissue]
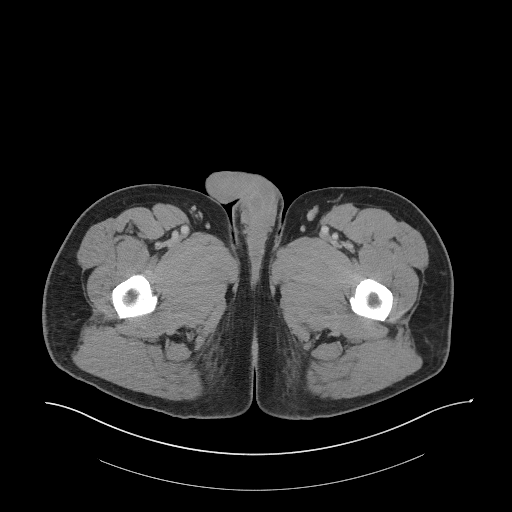
[im 5/99  bone]
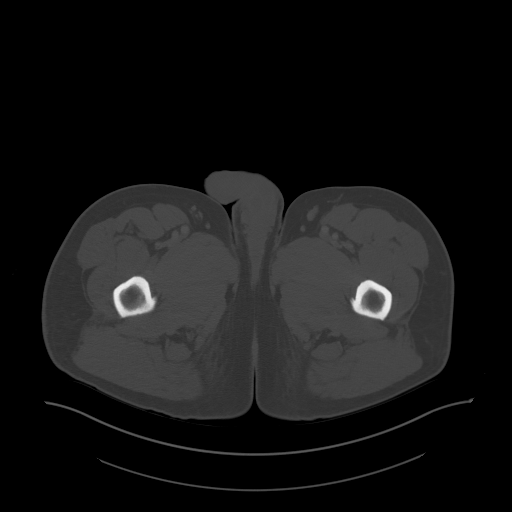
[im 15/99  soft-tissue]
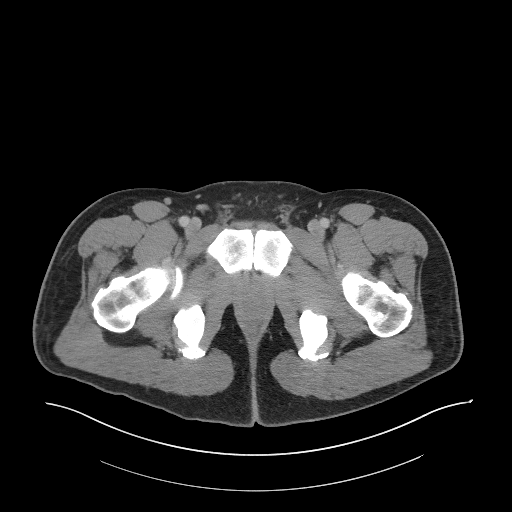
[im 20/99  soft-tissue]
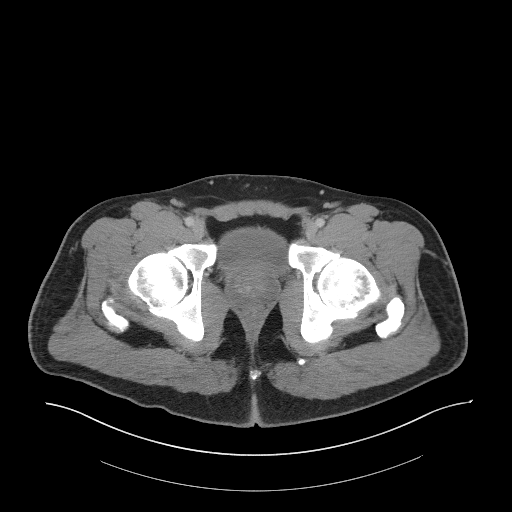
[im 30/99  soft-tissue]
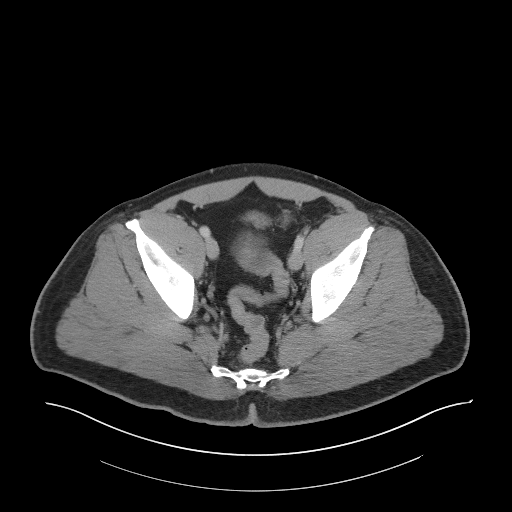
[im 35/99  soft-tissue]
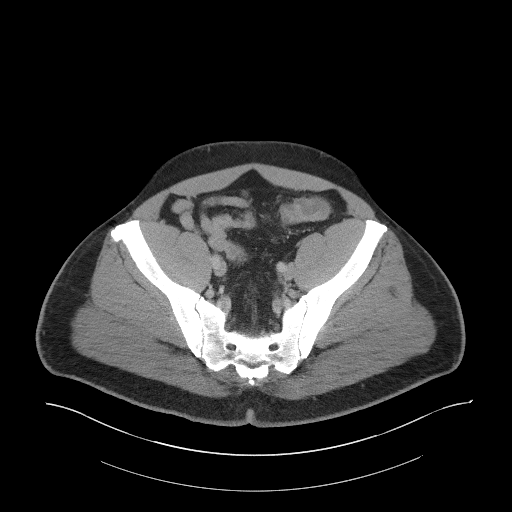
[im 45/99  soft-tissue]
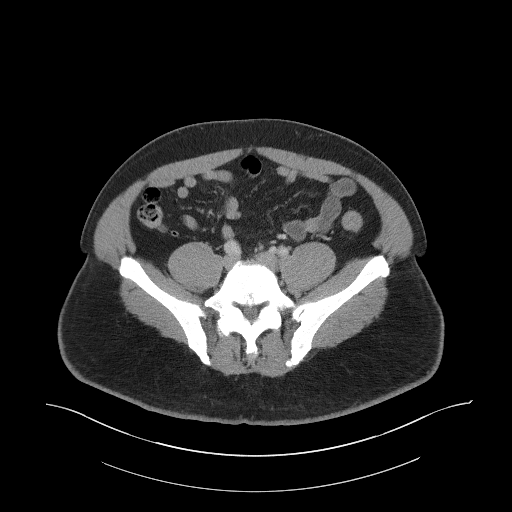
[im 50/99  soft-tissue]
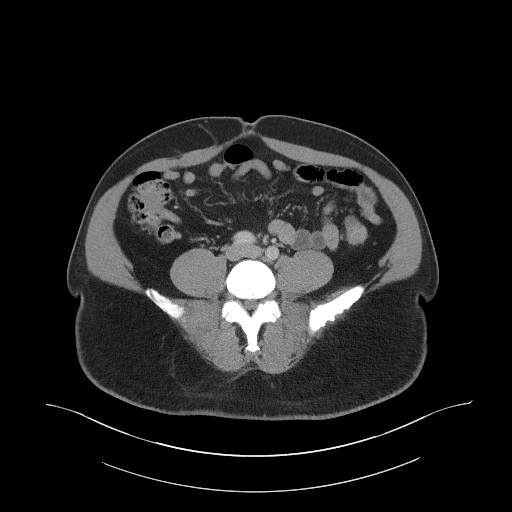
[im 54/99  soft-tissue]
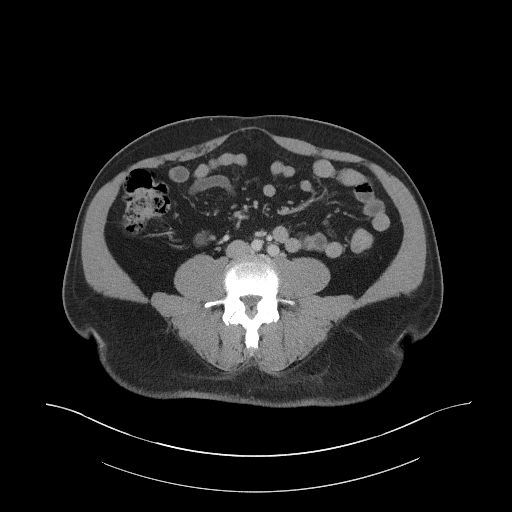
[im 64/99  soft-tissue]
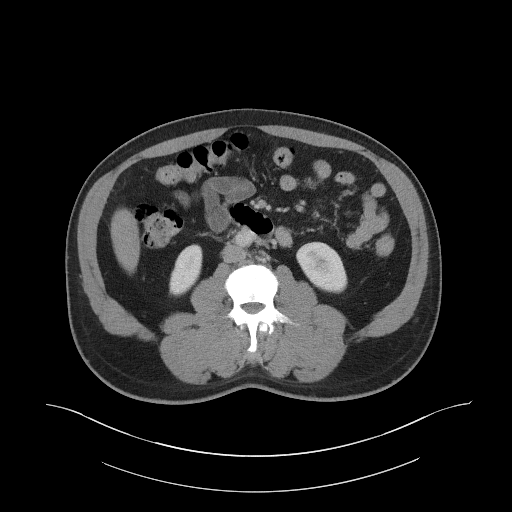
[im 64/99  bone]
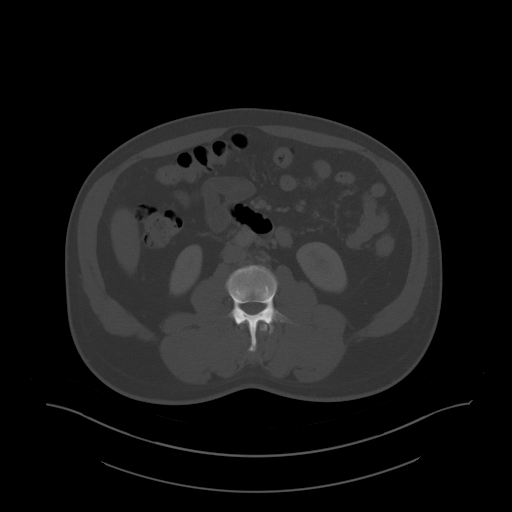
[im 69/99  soft-tissue]
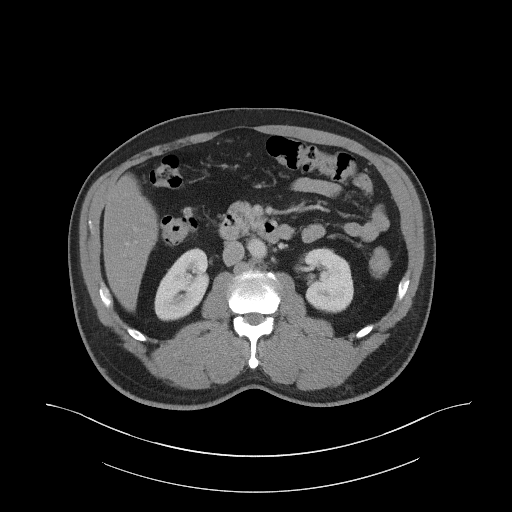
[im 79/99  soft-tissue]
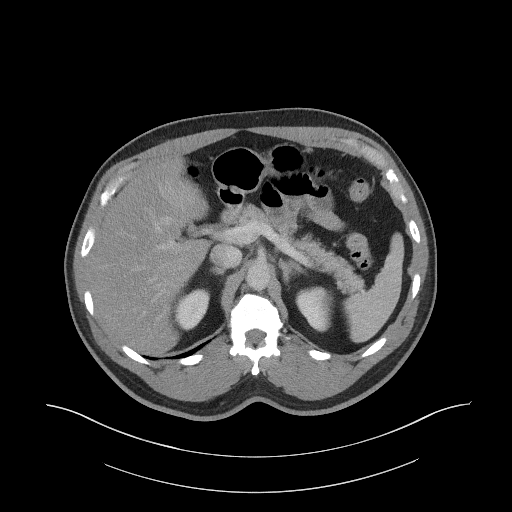
[im 84/99  soft-tissue]
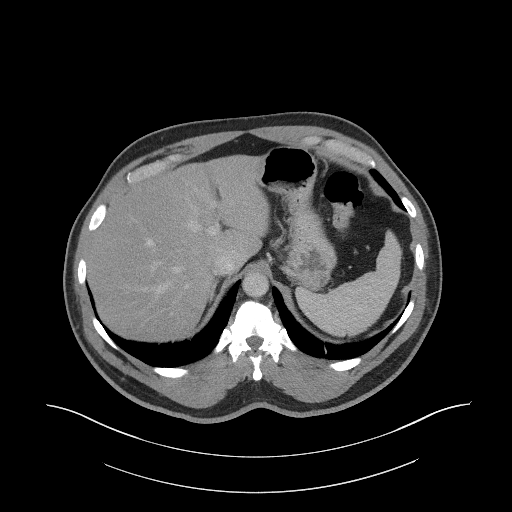
[im 94/99  soft-tissue]
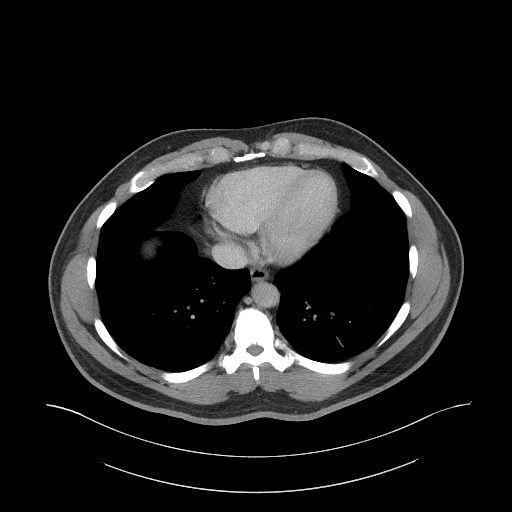

[Series 5: coronal st · coronal · 0.80mm/px · 3 of 101 slices shown]
[im 34/101  soft-tissue]
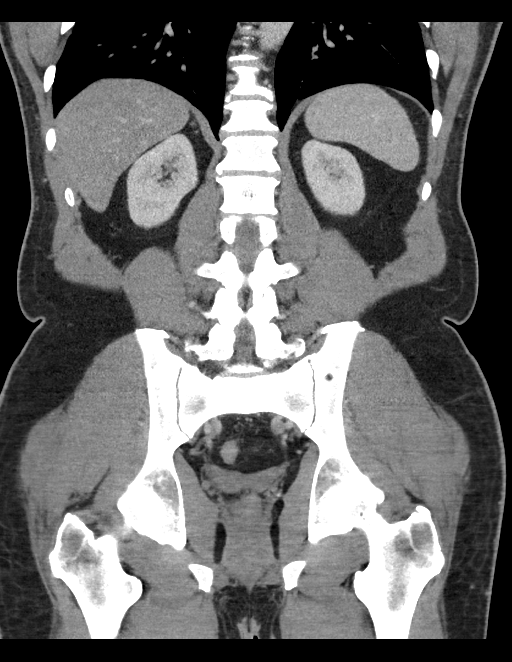
[im 45/101  soft-tissue]
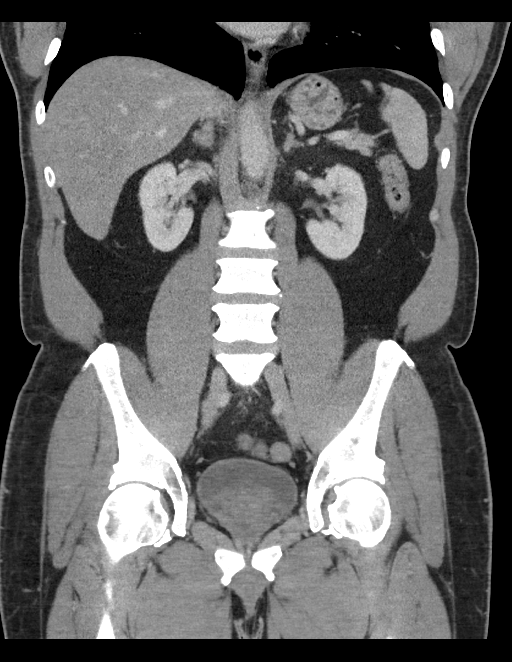
[im 56/101  soft-tissue]
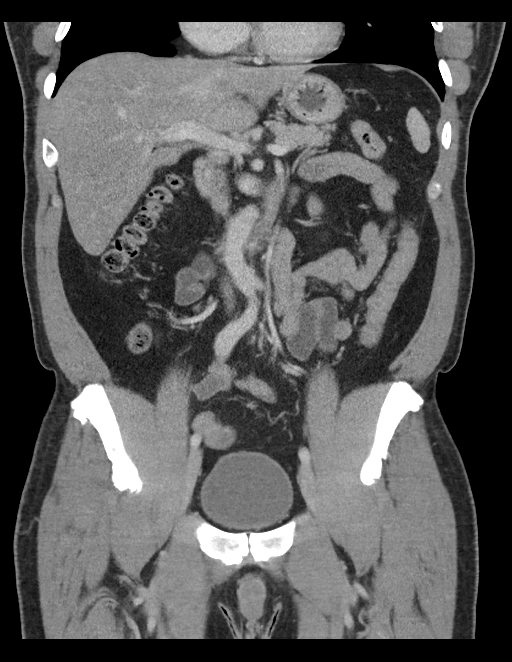

[16 of 46 positions shown; findings below may reference images not displayed]

FINDINGS: Lower chest: Lung bases demonstrate atelectasis at the bases. No
acute consolidation. Normal cardiomediastinal silhouette.

Hepatobiliary: No focal liver abnormality is seen. No gallstones,
gallbladder wall thickening, or biliary dilatation.

Pancreas: Unremarkable. No pancreatic ductal dilatation or
surrounding inflammatory changes.

Spleen: Normal in size without focal abnormality.

Adrenals/Urinary Tract: Adrenal glands are normal. No
hydronephrosis. Punctate nonobstructing stone lower pole left
kidney. The bladder is unremarkable.

Stomach/Bowel: Stomach is within normal limits. Appendix appears
normal. No evidence of bowel wall thickening, distention, or
inflammatory changes.

Vascular/Lymphatic: No significant vascular findings are present. No
enlarged abdominal or pelvic lymph nodes.

Reproductive: Enlarged prostate

Other: Negative for pelvic effusion or free air

Musculoskeletal: No acute or significant osseous findings.
IMPRESSION: 1. No CT evidence for acute intra-abdominal or pelvic abnormality.
2. Punctate nonobstructing stone in the left kidney.

## 2023-07-30 ENCOUNTER — Other Ambulatory Visit: Payer: Self-pay

## 2023-07-30 ENCOUNTER — Emergency Department (HOSPITAL_BASED_OUTPATIENT_CLINIC_OR_DEPARTMENT_OTHER): Payer: Managed Care, Other (non HMO)

## 2023-07-30 ENCOUNTER — Encounter (HOSPITAL_BASED_OUTPATIENT_CLINIC_OR_DEPARTMENT_OTHER): Payer: Self-pay | Admitting: Emergency Medicine

## 2023-07-30 ENCOUNTER — Inpatient Hospital Stay (HOSPITAL_BASED_OUTPATIENT_CLINIC_OR_DEPARTMENT_OTHER)
Admission: EM | Admit: 2023-07-30 | Discharge: 2023-08-04 | DRG: 378 | Disposition: A | Discharge status: Home / Self Care | Payer: Managed Care, Other (non HMO) | Attending: Internal Medicine | Admitting: Internal Medicine

## 2023-07-30 DIAGNOSIS — K219 Gastro-esophageal reflux disease without esophagitis: Secondary | ICD-10-CM | POA: Diagnosis present

## 2023-07-30 DIAGNOSIS — K581 Irritable bowel syndrome with constipation: Secondary | ICD-10-CM | POA: Diagnosis not present

## 2023-07-30 DIAGNOSIS — Z6831 Body mass index (BMI) 31.0-31.9, adult: Secondary | ICD-10-CM | POA: Diagnosis not present

## 2023-07-30 DIAGNOSIS — K5731 Diverticulosis of large intestine without perforation or abscess with bleeding: Principal | ICD-10-CM | POA: Diagnosis present

## 2023-07-30 DIAGNOSIS — N3281 Overactive bladder: Secondary | ICD-10-CM | POA: Diagnosis present

## 2023-07-30 DIAGNOSIS — Z79899 Other long term (current) drug therapy: Secondary | ICD-10-CM | POA: Diagnosis not present

## 2023-07-30 DIAGNOSIS — K922 Gastrointestinal hemorrhage, unspecified: Principal | ICD-10-CM

## 2023-07-30 DIAGNOSIS — M109 Gout, unspecified: Secondary | ICD-10-CM | POA: Diagnosis not present

## 2023-07-30 DIAGNOSIS — K760 Fatty (change of) liver, not elsewhere classified: Secondary | ICD-10-CM | POA: Diagnosis not present

## 2023-07-30 DIAGNOSIS — M62830 Muscle spasm of back: Secondary | ICD-10-CM | POA: Diagnosis present

## 2023-07-30 DIAGNOSIS — N400 Enlarged prostate without lower urinary tract symptoms: Secondary | ICD-10-CM | POA: Diagnosis present

## 2023-07-30 DIAGNOSIS — D62 Acute posthemorrhagic anemia: Secondary | ICD-10-CM | POA: Diagnosis not present

## 2023-07-30 DIAGNOSIS — E669 Obesity, unspecified: Secondary | ICD-10-CM | POA: Diagnosis not present

## 2023-07-30 DIAGNOSIS — K625 Hemorrhage of anus and rectum: Secondary | ICD-10-CM | POA: Diagnosis present

## 2023-07-30 DIAGNOSIS — M545 Low back pain, unspecified: Secondary | ICD-10-CM | POA: Diagnosis present

## 2023-07-30 LAB — CBC
HCT: 40.9 % (ref 39.0–52.0)
Hemoglobin: 13.9 g/dL (ref 13.0–17.0)
MCH: 30.7 pg (ref 26.0–34.0)
MCHC: 34 g/dL (ref 30.0–36.0)
MCV: 90.3 fL (ref 80.0–100.0)
Platelets: 188 10*3/uL (ref 150–400)
RBC: 4.53 MIL/uL (ref 4.22–5.81)
RDW: 13.6 % (ref 11.5–15.5)
WBC: 10.7 10*3/uL — ABNORMAL HIGH (ref 4.0–10.5)
nRBC: 0 % (ref 0.0–0.2)

## 2023-07-30 LAB — COMPREHENSIVE METABOLIC PANEL
ALT: 23 U/L (ref 0–44)
AST: 21 U/L (ref 15–41)
Albumin: 4.4 g/dL (ref 3.5–5.0)
Alkaline Phosphatase: 50 U/L (ref 38–126)
Anion gap: 8 (ref 5–15)
BUN: 14 mg/dL (ref 6–20)
CO2: 29 mmol/L (ref 22–32)
Calcium: 9 mg/dL (ref 8.9–10.3)
Chloride: 102 mmol/L (ref 98–111)
Creatinine, Ser: 1.11 mg/dL (ref 0.61–1.24)
GFR, Estimated: >60 mL/min (ref >60)
Glucose, Bld: 106 mg/dL — ABNORMAL HIGH (ref 70–99)
Potassium: 3.6 mmol/L (ref 3.5–5.1)
Sodium: 139 mmol/L (ref 135–145)
Total Bilirubin: 0.5 mg/dL (ref 0.0–1.2)
Total Protein: 7.3 g/dL (ref 6.5–8.1)

## 2023-07-30 LAB — CBC WITH DIFFERENTIAL/PLATELET
Abs Immature Granulocytes: 0.01 10*3/uL (ref 0.00–0.07)
Basophils Absolute: 0 10*3/uL (ref 0.0–0.1)
Basophils Relative: 0 %
Eosinophils Absolute: 0.1 10*3/uL (ref 0.0–0.5)
Eosinophils Relative: 1 %
HCT: 46.6 % (ref 39.0–52.0)
Hemoglobin: 16 g/dL (ref 13.0–17.0)
Immature Granulocytes: 0 %
Lymphocytes Relative: 39 %
Lymphs Abs: 2.2 10*3/uL (ref 0.7–4.0)
MCH: 30.6 pg (ref 26.0–34.0)
MCHC: 34.3 g/dL (ref 30.0–36.0)
MCV: 89.1 fL (ref 80.0–100.0)
Monocytes Absolute: 0.4 10*3/uL (ref 0.1–1.0)
Monocytes Relative: 6 %
Neutro Abs: 3.1 10*3/uL (ref 1.7–7.7)
Neutrophils Relative %: 54 %
Platelets: 201 10*3/uL (ref 150–400)
RBC: 5.23 MIL/uL (ref 4.22–5.81)
RDW: 13.4 % (ref 11.5–15.5)
WBC: 5.8 10*3/uL (ref 4.0–10.5)
nRBC: 0 % (ref 0.0–0.2)

## 2023-07-30 LAB — LIPASE, BLOOD: Lipase: 66 U/L — ABNORMAL HIGH (ref 11–51)

## 2023-07-30 MED ORDER — IOHEXOL 350 MG/ML SOLN
100.0000 mL | Freq: Once | INTRAVENOUS | Status: AC | PRN
  Administered 2023-07-30: 100 mL via INTRAVENOUS

## 2023-07-30 MED ORDER — LACTATED RINGERS IV BOLUS
500.0000 mL | Freq: Once | INTRAVENOUS | Status: AC
  Administered 2023-07-30: 500 mL via INTRAVENOUS

## 2023-07-30 MED ORDER — SODIUM CHLORIDE 0.9 % IV BOLUS
1000.0000 mL | Freq: Once | INTRAVENOUS | Status: AC
  Administered 2023-07-30: 1000 mL via INTRAVENOUS

## 2023-07-30 MED ORDER — ONDANSETRON HCL 4 MG/2ML IJ SOLN
4.0000 mg | Freq: Once | INTRAMUSCULAR | Status: AC
  Administered 2023-07-30: 4 mg via INTRAVENOUS
  Filled 2023-07-30: qty 2

## 2023-07-30 MED ORDER — FENTANYL CITRATE PF 50 MCG/ML IJ SOSY
50.0000 ug | PREFILLED_SYRINGE | Freq: Once | INTRAMUSCULAR | Status: AC
  Administered 2023-07-30: 50 ug via INTRAVENOUS
  Filled 2023-07-30: qty 1

## 2023-07-30 MED ORDER — PANTOPRAZOLE SODIUM 40 MG IV SOLR
40.0000 mg | Freq: Once | INTRAVENOUS | Status: AC
  Administered 2023-07-30: 40 mg via INTRAVENOUS
  Filled 2023-07-30: qty 10

## 2023-07-30 NOTE — ED Notes (Signed)
 Pt had near syncopal episode after having bloody BM/ pt was placed in wheelchair and placed back in bed by 4 staff members/ pt pale and diaphoretic/ pt unable to answer questions at this time/ vitals stable/ wife and MD at bedside/ pt placed on cardiac monitor

## 2023-07-30 NOTE — ED Provider Notes (Addendum)
 [place] EMERGENCY DEPARTMENT AT [facility] Provider Note   CSN: [id number] Arrival date & time: 07/30/23  1828     History  Chief Complaint  Patient presents with   Rectal Bleeding   Abdominal Pain    Christopher Massey is a 58 y.o. male.  Patient here with rectal bleeding.  Had bright red bloody bowel movement prior to coming in.  He has been having some lower abdominal cramping.  Patient is not on any blood thinners.  No recent alcohol use or heavy alcohol use in the past.  Occasionally takes ibuprofen .  Denies any chest pain shortness of breath.  He had another bowel movement prior to my evaluation that was a little bit darker more maroon.  He got lightheaded and dizzy with it.  He denies any GI bleed history.  He had a colonoscopy a couple years ago that was unremarkable.  Denies any diarrhea or recent illness.  The history is provided by the patient.       Home Medications Prior to Admission medications   Medication Sig Start Date End Date Taking? Authorizing Provider  acetaminophen  (TYLENOL  8 HOUR) 650 MG CR tablet Take 1 tablet (650 mg total) by mouth every 8 (eight) hours as needed for pain. 07/30/17   Charlyn Sora, MD  allopurinol  (ZYLOPRIM ) 100 MG tablet Take by mouth.    [provider]  cefdinir  (OMNICEF ) 300 MG capsule Take 1 capsule (300 mg total) by mouth 2 (two) times daily. X 10 days 02/07/14   Jaycee Lenis, MD  colchicine 0.6 MG tablet Take 2 tablets by mouth once today then 1 tablet by mouth 1 hour later, then take 1 tablet by mouth 3 times daily for 10 days. 05/20/19   [provider]  HYDROcodone -acetaminophen  (NORCO/VICODIN) 5-325 MG tablet Take 1 tablet by mouth every 4 (four) hours as needed. 03/02/21   Beverley Leita LABOR, PA-C  ibuprofen  (ADVIL ,MOTRIN ) 600 MG tablet Take 1 tablet (600 mg total) by mouth every 6 (six) hours as needed. 07/30/17   Charlyn Sora, MD  omeprazole (PRILOSEC) 20 MG capsule Take 20 mg by mouth daily.    [provider]  omeprazole (PRILOSEC) 20 MG capsule Take 2 capsules by mouth daily. 12/13/11   [provider]  ondansetron  (ZOFRAN  ODT) 4 MG disintegrating tablet Take 1 tablet (4 mg total) by mouth every 8 (eight) hours as needed for nausea or vomiting. 03/02/21   Beverley Leita LABOR, PA-C  predniSONE  (STERAPRED UNI-PAK) 10 MG tablet Take as directed for 6 days.--Take 6 on day 1, 5 on day 2, 4 on day 3, then 3 tablets on day 4, then 2 tablets on day 5, then 1 on day 6. 02/07/14   Jaycee Lenis, MD  promethazine -codeine  (PHENERGAN  WITH CODEINE ) 6.25-10 MG/5ML syrup Take 1-2 teaspoons every 4-6 hours as needed for cough. May cause drowsiness. 02/07/14   Jaycee Lenis, MD      Allergies    Patient has no known allergies.    Review of Systems   Review of Systems  Physical Exam Updated Vital Signs  ED Triage Vitals  Encounter Vitals Group     BP 07/30/23 1835 (!) 157/107     Systolic BP Percentile --      Diastolic BP Percentile --      Pulse Rate 07/30/23 1835 (!) 114     Resp 07/30/23 1837 18     Temp 07/30/23 1839 99.1 F (37.3 C)     Temp Source 07/30/23 1839  Oral     SpO2 07/30/23 1835 97 %     Weight --      Height --      Head Circumference --      Peak Flow --      Pain Score 07/30/23 1837 6     Pain Loc --      Pain Education --      Exclude from Growth Chart --      Physical Exam Vitals and nursing note reviewed.  Constitutional:      General: He is not in acute distress.    Appearance: He is well-developed. He is ill-appearing.  HENT:     Head: Normocephalic and atraumatic.     Mouth/Throat:     Mouth: Mucous membranes are moist.  Eyes:     Extraocular Movements: Extraocular movements intact.     Conjunctiva/sclera: Conjunctivae normal.  Cardiovascular:     Rate and Rhythm: Normal rate and regular rhythm.     Heart sounds: Normal heart sounds. No murmur heard. Pulmonary:     Effort: Pulmonary effort is normal. No respiratory distress.     Breath sounds:  Normal breath sounds.  Abdominal:     Palpations: Abdomen is soft.     Tenderness: There is abdominal tenderness in the left lower quadrant.  Musculoskeletal:        General: No swelling.     Cervical back: Neck supple.  Skin:    General: Skin is warm and dry.     Capillary Refill: Capillary refill takes less than 2 seconds.  Neurological:     General: No focal deficit present.     Mental Status: He is alert.  Psychiatric:        Mood and Affect: Mood normal.     ED Results / Procedures / Treatments   Labs (all labs ordered are listed, but only abnormal results are displayed) Labs Reviewed  COMPREHENSIVE METABOLIC PANEL - Abnormal; Notable for the following components:      Result Value   Glucose, Bld 106 (*)    All other components within normal limits  LIPASE, BLOOD - Abnormal; Notable for the following components:   Lipase 66 (*)    All other components within normal limits  CBC WITH DIFFERENTIAL/PLATELET  CBC    EKG EKG Interpretation Date/Time:  Sunday July 30 2023 19:52:58 EST Ventricular Rate:  109 PR Interval:  154 QRS Duration:  108 QT Interval:  333 QTC Calculation: 449 R Axis:   -36  Text Interpretation: Sinus tachycardia Confirmed by Ruthe Cornet [phone]) on 07/30/2023 8:05:06 PM  Radiology CT ANGIO GI BLEED Result Date: 07/30/2023 CLINICAL DATA:  Left lower quadrant pain, back pain. Right red blood in stool. EXAM: CTA ABDOMEN AND PELVIS WITHOUT AND WITH CONTRAST TECHNIQUE: Multidetector CT imaging of the abdomen and pelvis was performed using the standard protocol during bolus administration of intravenous contrast. Multiplanar reconstructed images and MIPs were obtained and reviewed to evaluate the vascular anatomy. RADIATION DOSE REDUCTION: This exam was performed according to the departmental dose-optimization program which includes automated exposure control, adjustment of the mA and/or kV according to patient size and/or use of iterative  reconstruction technique. CONTRAST:  OMNIPAQUE  IOHEXOL  350 MG/ML SOLN COMPARISON:  03/02/2021 FINDINGS: VASCULAR Aorta: Normal caliber aorta without aneurysm, dissection, vasculitis or significant stenosis. Celiac: Patent without evidence of aneurysm, dissection, vasculitis or significant stenosis. SMA: Patent without evidence of aneurysm, dissection, vasculitis or significant stenosis. Renals: Both renal arteries are patent without evidence  of aneurysm, dissection, vasculitis, fibromuscular dysplasia or significant stenosis. IMA: Patent without evidence of aneurysm, dissection, vasculitis or significant stenosis. Inflow: Patent without evidence of aneurysm, dissection, vasculitis or significant stenosis. Proximal Outflow: Bilateral common femoral and visualized portions of the superficial and profunda femoral arteries are patent without evidence of aneurysm, dissection, vasculitis or significant stenosis. Veins: No obvious venous abnormality within the limitations of this arterial phase study. Review of the MIP images confirms the above findings. NON-VASCULAR Lower chest: Linear atelectasis or scarring in the lower lobes. No effusions. Hepatobiliary: Diffuse low-density throughout the liver compatible with fatty infiltration. No focal abnormality. Gallbladder unremarkable. Pancreas: No focal abnormality or ductal dilatation. Spleen: No focal abnormality.  Normal size. Adrenals/Urinary Tract: Punctate left lower pole nephrolithiasis. No ureteral stones or hydronephrosis. Adrenal glands and urinary bladder unremarkable. Stomach/Bowel: Normal appendix. Few scattered colonic diverticula. No active diverticulitis. No contrast extravasation to localize GI bleed. Stomach and small bowel decompressed. Lymphatic: No adenopathy Reproductive: Mildly enlarged prostate. Other: No free fluid or free air. Musculoskeletal: No acute bony abnormality. IMPRESSION: VASCULAR No contrast extravasation to localize GI bleed.  NON-VASCULAR Few scattered colonic diverticula.  No active diverticulitis. Left lower pole nephrolithiasis.  No hydronephrosis. Hepatic steatosis. Electronically Signed   By: Franky Crease M.D.   On: 07/30/2023 20:54    Procedures Procedures    Medications Ordered in ED Medications  pantoprazole  (PROTONIX ) injection 40 mg (40 mg Intravenous Given 07/30/23 1953)  ondansetron  (ZOFRAN ) injection 4 mg (4 mg Intravenous Given 07/30/23 1953)  sodium chloride  0.9 % bolus 1,000 mL (0 mLs Intravenous Stopped 07/30/23 2031)  iohexol  (OMNIPAQUE ) 350 MG/ML injection 100 mL (100 mLs Intravenous Contrast Given 07/30/23 2034)    ED Course/ Medical Decision Making/ A&P                                 Medical Decision Making Amount and/or Complexity of Data Reviewed Labs: ordered. Radiology: ordered.  Risk Prescription drug management. Decision regarding hospitalization.   Christopher Massey is here with rectal bleeding and abdominal pain.  Unremarkable vitals.  No fever.  He is had 4-5 bloody bowel movements now while I have seen him.  He had 1 prior to coming here and now 3 or 4 during my evaluation with him.  He is having some vagal episodes with this as well.  He did not lose consciousness or hit his head.  He had a colonoscopy couple years ago that showed diverticulosis but otherwise was unremarkable.  Differential diagnosis likely lower GI bleed likely diverticular bleeding.  Seems less likely to be an upper GI bleed.  Will get a CT angio to further evaluate including CBC BMP.  Will give fluid bolus and IV Zofran .  Per my review and interpretation of labs no significant anemia or electrolyte abnormality kidney injury or leukocytosis.  I have talked with Dr. Rollin who is the patient's gastroenterologist.  He is aware.  Awaiting CT angio and anticipate admission to medicine.  Patient's been given Protonix , IV fluids and Zofran .  CT angio is unremarkable.  Overall I do think that this is a lower GI bleed likely  diverticular.  Will admit to hospitalist for further care.  Will trend CBC if patient is still here.  Remains hemodynamically stable at this time.  Given the amount of bleeding he is had an episodes of bleeding he is had I do think it be safe to have him wait for his  hospital bed at [facility] emergency department.  Dr. Franklyn Dr. Tegeler aware and hospitalist team agrees with this plan as well.  This chart was dictated using voice recognition software.  Despite best efforts to proofread,  errors can occur which can change the documentation meaning.    Final Clinical Impression(s) / ED Diagnoses Final diagnoses:  Gastrointestinal hemorrhage, unspecified gastrointestinal hemorrhage type    Rx / DC Orders ED Discharge Orders     None         Ruthe Cornet, DO 07/30/23 2103    Ruthe Cornet, DO 07/30/23 2130

## 2023-07-30 NOTE — ED Triage Notes (Signed)
 Today around 6pm notice bright red blood in toilet  Some lower left side/ back pain started 3 days ago  cramping

## 2023-07-30 NOTE — ED Notes (Signed)
 Pt drowsy but able to answer questions appropriately/ color change has improved/ pt no longer diaphoretic/ vitals stable

## 2023-07-30 NOTE — ED Notes (Signed)
 Pt had another episode of bright red rectal bleeding/ large amount was seen in toilet

## 2023-07-30 NOTE — Progress Notes (Signed)
 Plan of Care Note for accepted transfer   Patient: Christopher Massey MRN: [id number]   DOA: 07/30/2023  Facility requesting transfer: [facility].  ER. Requesting Provider: Dr. Avonne. Reason for transfer: Acute GI bleed. Facility course: 58 year old male with no significant past medical history presents with multiple episodes of rectal bleeding.  In the ER also patient had some rectal bleeding.  CT scan angio abdomen does not show any active extravasation.  ER physician discussed with Dr. Rollin.  Patient has had a colonoscopy 2 years ago which showed diverticulosis.  Patient is getting slightly tachycardic ER physician is going to do ER to ER transfer.  Plan of care: The patient is accepted for admission to Progressive unit, at [facility]..  Author: Redia LOISE Cleaver, MD 07/30/2023  Check [email or web address] for on-call coverage.  Nursing staff, Please call [facility] Admits & Consults System-Wide number on Amion as soon as patient's arrival, so appropriate admitting provider can evaluate the pt.

## 2023-07-30 NOTE — ED Notes (Signed)
-  Called [facility] at 926pm for transportation to [facility] ED. Dr. Rush Landmark accepting.

## 2023-07-31 ENCOUNTER — Encounter (HOSPITAL_COMMUNITY): Payer: Self-pay | Admitting: Internal Medicine

## 2023-07-31 DIAGNOSIS — D62 Acute posthemorrhagic anemia: Secondary | ICD-10-CM | POA: Diagnosis present

## 2023-07-31 DIAGNOSIS — K581 Irritable bowel syndrome with constipation: Secondary | ICD-10-CM | POA: Diagnosis present

## 2023-07-31 DIAGNOSIS — M545 Low back pain, unspecified: Secondary | ICD-10-CM | POA: Diagnosis present

## 2023-07-31 DIAGNOSIS — Z6831 Body mass index (BMI) 31.0-31.9, adult: Secondary | ICD-10-CM | POA: Diagnosis not present

## 2023-07-31 DIAGNOSIS — K625 Hemorrhage of anus and rectum: Secondary | ICD-10-CM | POA: Diagnosis present

## 2023-07-31 DIAGNOSIS — N3281 Overactive bladder: Secondary | ICD-10-CM | POA: Diagnosis present

## 2023-07-31 DIAGNOSIS — N400 Enlarged prostate without lower urinary tract symptoms: Secondary | ICD-10-CM | POA: Diagnosis present

## 2023-07-31 DIAGNOSIS — K219 Gastro-esophageal reflux disease without esophagitis: Secondary | ICD-10-CM | POA: Diagnosis present

## 2023-07-31 DIAGNOSIS — K760 Fatty (change of) liver, not elsewhere classified: Secondary | ICD-10-CM | POA: Diagnosis present

## 2023-07-31 DIAGNOSIS — M62830 Muscle spasm of back: Secondary | ICD-10-CM | POA: Diagnosis present

## 2023-07-31 DIAGNOSIS — Z79899 Other long term (current) drug therapy: Secondary | ICD-10-CM | POA: Diagnosis not present

## 2023-07-31 DIAGNOSIS — K921 Melena: Secondary | ICD-10-CM | POA: Diagnosis not present

## 2023-07-31 DIAGNOSIS — K5731 Diverticulosis of large intestine without perforation or abscess with bleeding: Secondary | ICD-10-CM | POA: Diagnosis present

## 2023-07-31 DIAGNOSIS — M109 Gout, unspecified: Secondary | ICD-10-CM | POA: Diagnosis present

## 2023-07-31 DIAGNOSIS — E669 Obesity, unspecified: Secondary | ICD-10-CM | POA: Diagnosis present

## 2023-07-31 LAB — BASIC METABOLIC PANEL
Anion gap: 9 (ref 5–15)
BUN: 13 mg/dL (ref 6–20)
CO2: 22 mmol/L (ref 22–32)
Calcium: 8.4 mg/dL — ABNORMAL LOW (ref 8.9–10.3)
Chloride: 105 mmol/L (ref 98–111)
Creatinine, Ser: 1.16 mg/dL (ref 0.61–1.24)
GFR, Estimated: >60 mL/min (ref >60)
Glucose, Bld: 153 mg/dL — ABNORMAL HIGH (ref 70–99)
Potassium: 5 mmol/L (ref 3.5–5.1)
Sodium: 136 mmol/L (ref 135–145)

## 2023-07-31 LAB — CBC
HCT: 39 % (ref 39.0–52.0)
Hemoglobin: 13 g/dL (ref 13.0–17.0)
MCH: 30.8 pg (ref 26.0–34.0)
MCHC: 33.3 g/dL (ref 30.0–36.0)
MCV: 92.4 fL (ref 80.0–100.0)
Platelets: 177 10*3/uL (ref 150–400)
RBC: 4.22 MIL/uL (ref 4.22–5.81)
RDW: 13.8 % (ref 11.5–15.5)
WBC: 8.6 10*3/uL (ref 4.0–10.5)
nRBC: 0 % (ref 0.0–0.2)

## 2023-07-31 LAB — HIV ANTIBODY (ROUTINE TESTING W REFLEX): HIV Screen 4th Generation wRfx: NONREACTIVE

## 2023-07-31 LAB — ABO/RH: ABO/RH(D): B POS

## 2023-07-31 LAB — TYPE AND SCREEN
ABO/RH(D): B POS
Antibody Screen: NEGATIVE

## 2023-07-31 LAB — HEMOGLOBIN AND HEMATOCRIT, BLOOD
HCT: 37.5 % — ABNORMAL LOW (ref 39.0–52.0)
Hemoglobin: 12.6 g/dL — ABNORMAL LOW (ref 13.0–17.0)

## 2023-07-31 MED ORDER — ACETAMINOPHEN 325 MG PO TABS
650.0000 mg | ORAL_TABLET | Freq: Four times a day (QID) | ORAL | Status: DC | PRN
  Administered 2023-08-01 – 2023-08-03 (×2): 650 mg via ORAL
  Filled 2023-07-31 (×2): qty 2

## 2023-07-31 MED ORDER — ACETAMINOPHEN 650 MG RE SUPP: 650.0000 mg | Freq: Four times a day (QID) | RECTAL | Status: DC | PRN

## 2023-07-31 MED ORDER — ONDANSETRON HCL 4 MG PO TABS: 4.0000 mg | ORAL_TABLET | Freq: Four times a day (QID) | ORAL | Status: DC | PRN

## 2023-07-31 MED ORDER — ONDANSETRON HCL 4 MG/2ML IJ SOLN: 4.0000 mg | Freq: Four times a day (QID) | INTRAMUSCULAR | Status: DC | PRN

## 2023-07-31 MED ORDER — LACTATED RINGERS IV SOLN: INTRAVENOUS | Status: DC

## 2023-07-31 NOTE — Assessment & Plan Note (Addendum)
 No red flag symptoms. - Follow up with PCP

## 2023-07-31 NOTE — Progress Notes (Signed)
  Progress Note   Patient: Christopher Massey FMW:[id number] DOB: 12-03-1965 DOA: 07/30/2023     0 DOS: the patient was seen and examined on 07/31/2023 at 10:27AM      Brief hospital course: 58 y.o. M with hx low back pain who presented bright red rectal bleeding.     Assessment and Plan: * Rectal bleeding Started out bright red, then became maroon / dark.  No anticoagulation. Denies heavy NSAID use.   Hgb dropped from 16 recently to 12 today and he syncopized in the ER.  CTA negative for source - CLD - Trend Hgb - Consult GI, appreciate expertise     Lumbago No red flag symptoms. - Follow up with PCP          Subjective: Patient still feels dizzy with standing, although his orthostatics are negative.  He has had no further melena or hematochezia.     Physical Exam: BP (!) 122/94   Pulse 88   Temp 98.2 F (36.8 C) (Oral)   Resp 14   Ht 6' 1 (1.854 m)   Wt 109.8 kg   SpO2 100%   BMI 31.94 kg/m   The patient was seen and examined.  This is a no charge note, for further details, please see the H&P by my partner Dr. Lonzell from earlier today     Disposition: Status is: Inpatient Patient was admitted with hematochezia.  He syncopized in the ER and is still somewhat symptomatic and has had a notable 4 point drop in hemoglobin recently.  We will observe overnight, and if stable, no further bleeding, likely discharge tomorrow with GI follow-up        Author: Lonni SHAUNNA Dalton, MD 07/31/2023 6:31 PM  For on call review [email or web address].

## 2023-07-31 NOTE — H&P (Signed)
 History and Physical    Patient: Christopher Massey FMW:[id number] DOB: 1966/07/11 DOA: 07/30/2023 DOS: the patient was seen and examined on 07/31/2023 PCP: [name]., MD  Patient coming from: Home  Chief Complaint:  Chief Complaint  Patient presents with   Rectal Bleeding   Abdominal Pain   HPI: Rishi Vicario is a 58 y.o. male with medical history significant of GERD, gout, cervical spinal disk disease.  Pt with low back pain ever since he bent over to pick something up last week.  No pain shooting down legs, no leg weakness, numbness, saddle anesthesia, nor increased difficulty urinating from baseline (pt also has BPH though).  Pt taking mostly tylenol  and prescribed Neurontin  for back pain, not really taking OTC NSAIDS.  Today pt had sudden onset of GIB: started out as BRBPR before becoming dark / maroon in color.  Blood loss was pretty severe at first, now slowed down.  Review of Systems: As mentioned in the history of present illness. All other systems reviewed and are negative. Past Medical History:  Diagnosis Date   GERD (gastroesophageal reflux disease)    Gout    IBS (irritable bowel syndrome)    Past Surgical History:  Procedure Laterality Date   SHOULDER ARTHROSCOPY Bilateral    Social History:  reports that he has never smoked. He has never used smokeless tobacco. He reports that he does not drink alcohol and does not use drugs.  No Known Allergies  History reviewed. No pertinent family history.  Prior to Admission medications   Medication Sig Start Date End Date Taking? Authorizing Provider  allopurinol  (ZYLOPRIM ) 300 MG tablet Take 300 mg by mouth daily. 09/10/22 11/02/23 Yes [provider]  gabapentin  (NEURONTIN ) 300 MG capsule Take 300 mg by mouth daily as needed (for nerve pain).   Yes [provider]  omeprazole (PRILOSEC) 20 MG capsule Take 20 mg by mouth daily as needed (for heartburn).   Yes [provider]  oxybutynin   (DITROPAN -XL) 10 MG 24 hr tablet TAKE ONE TABLET BY MOUTH DAILY FOR OVERACTIVE BLADDER 07/26/22 11/02/23 Yes [provider]  tamsulosin  (FLOMAX ) 0.4 MG CAPS capsule Take 0.4 mg by mouth daily. 07/26/22 11/02/23 Yes [provider]    Physical Exam: Vitals:   07/30/23 2244 07/31/23 0030 07/31/23 0200 07/31/23 0237  BP:  104/84 114/76 108/77  Pulse: (!) 102 (!) 105  100  Resp: 20 19 16 16   Temp:    99.1 F (37.3 C)  TempSrc:    Oral  SpO2: 95% 100% 99% 96%   Constitutional: NAD, calm, comfortable Respiratory: clear to auscultation bilaterally, no wheezing, no crackles. Normal respiratory effort. No accessory muscle use.  Cardiovascular: Regular rate and rhythm, no murmurs / rubs / gallops. No extremity edema. 2+ pedal pulses. No carotid bruits.  Abdomen: no tenderness, no masses palpated. No hepatosplenomegaly. Bowel sounds positive.  Neurologic: CN 2-12 grossly intact. Sensation intact, DTR normal. Strength 5/5 in all 4.  Psychiatric: Normal judgment and insight. Alert and oriented x 3. Normal mood.   Data Reviewed:    Labs on Admission: I have personally reviewed following labs and imaging studies  CBC: Recent Labs  Lab 07/30/23 1910 07/30/23 2153 07/31/23 0326  WBC 5.8 10.7* 8.6  NEUTROABS 3.1  --   --   HGB 16.0 13.9 13.0  HCT 46.6 40.9 39.0  MCV 89.1 90.3 92.4  PLT 201 188 177   Basic Metabolic Panel: Recent Labs  Lab 07/30/23 1910 07/31/23 0326  NA 139  136  K 3.6 5.0  CL 102 105  CO2 29 22  GLUCOSE 106* 153*  BUN 14 13  CREATININE 1.11 1.16  CALCIUM 9.0 8.4*   GFR: CrCl cannot be calculated (Unknown ideal weight.). Liver Function Tests: Recent Labs  Lab 07/30/23 1910  AST 21  ALT 23  ALKPHOS 50  BILITOT 0.5  PROT 7.3  ALBUMIN 4.4   Recent Labs  Lab 07/30/23 1910  LIPASE 66*   No results for input(s): AMMONIA in the last 168 hours. Coagulation Profile: No results for input(s): INR, PROTIME in the last 168 hours. Cardiac  Enzymes: No results for input(s): CKTOTAL, CKMB, CKMBINDEX, TROPONINI in the last 168 hours. BNP (last 3 results) No results for input(s): PROBNP in the last 8760 hours. HbA1C: No results for input(s): HGBA1C in the last 72 hours. CBG: No results for input(s): GLUCAP in the last 168 hours. Lipid Profile: No results for input(s): CHOL, HDL, LDLCALC, TRIG, CHOLHDL, LDLDIRECT in the last 72 hours. Thyroid Function Tests: No results for input(s): TSH, T4TOTAL, FREET4, T3FREE, THYROIDAB in the last 72 hours. Anemia Panel: No results for input(s): VITAMINB12, FOLATE, FERRITIN, TIBC, IRON, RETICCTPCT in the last 72 hours. Urine analysis:    Component Value Date/Time   COLORURINE YELLOW 03/02/2021 1659   APPEARANCEUR CLEAR 03/02/2021 1659   LABSPEC 1.025 03/02/2021 1659   PHURINE 6.0 03/02/2021 1659   GLUCOSEU NEGATIVE 03/02/2021 1659   HGBUR TRACE (A) 03/02/2021 1659   BILIRUBINUR NEGATIVE 03/02/2021 1659   KETONESUR NEGATIVE 03/02/2021 1659   PROTEINUR NEGATIVE 03/02/2021 1659   NITRITE NEGATIVE 03/02/2021 1659   LEUKOCYTESUR NEGATIVE 03/02/2021 1659    Radiological Exams on Admission: CT ANGIO GI BLEED Result Date: 07/30/2023 CLINICAL DATA:  Left lower quadrant pain, back pain. Right red blood in stool. EXAM: CTA ABDOMEN AND PELVIS WITHOUT AND WITH CONTRAST TECHNIQUE: Multidetector CT imaging of the abdomen and pelvis was performed using the standard protocol during bolus administration of intravenous contrast. Multiplanar reconstructed images and MIPs were obtained and reviewed to evaluate the vascular anatomy. RADIATION DOSE REDUCTION: This exam was performed according to the departmental dose-optimization program which includes automated exposure control, adjustment of the mA and/or kV according to patient size and/or use of iterative reconstruction technique. CONTRAST:  OMNIPAQUE  IOHEXOL  350 MG/ML SOLN COMPARISON:  03/02/2021  FINDINGS: VASCULAR Aorta: Normal caliber aorta without aneurysm, dissection, vasculitis or significant stenosis. Celiac: Patent without evidence of aneurysm, dissection, vasculitis or significant stenosis. SMA: Patent without evidence of aneurysm, dissection, vasculitis or significant stenosis. Renals: Both renal arteries are patent without evidence of aneurysm, dissection, vasculitis, fibromuscular dysplasia or significant stenosis. IMA: Patent without evidence of aneurysm, dissection, vasculitis or significant stenosis. Inflow: Patent without evidence of aneurysm, dissection, vasculitis or significant stenosis. Proximal Outflow: Bilateral common femoral and visualized portions of the superficial and profunda femoral arteries are patent without evidence of aneurysm, dissection, vasculitis or significant stenosis. Veins: No obvious venous abnormality within the limitations of this arterial phase study. Review of the MIP images confirms the above findings. NON-VASCULAR Lower chest: Linear atelectasis or scarring in the lower lobes. No effusions. Hepatobiliary: Diffuse low-density throughout the liver compatible with fatty infiltration. No focal abnormality. Gallbladder unremarkable. Pancreas: No focal abnormality or ductal dilatation. Spleen: No focal abnormality.  Normal size. Adrenals/Urinary Tract: Punctate left lower pole nephrolithiasis. No ureteral stones or hydronephrosis. Adrenal glands and urinary bladder unremarkable. Stomach/Bowel: Normal appendix. Few scattered colonic diverticula. No active diverticulitis. No contrast extravasation to localize GI bleed. Stomach and small  bowel decompressed. Lymphatic: No adenopathy Reproductive: Mildly enlarged prostate. Other: No free fluid or free air. Musculoskeletal: No acute bony abnormality. IMPRESSION: VASCULAR No contrast extravasation to localize GI bleed. NON-VASCULAR Few scattered colonic diverticula.  No active diverticulitis. Left lower pole nephrolithiasis.   No hydronephrosis. Hepatic steatosis. Electronically Signed   By: Franky Crease M.D.   On: 07/30/2023 20:54    EKG: Independently reviewed.   Assessment and Plan: * Rectal bleeding Pt with GIB.  Started out bright red, now maroon / dark. Not on thinners, no heavy use of NSAIDs despite back pain this past week. Diverticular bleed vs stomach ulcer? Clear liquid diet Repeat CBC this AM shows HGB 13.0 H/H Q8H Type and screen Transfuse if needed EDP d/w Dr. Rollin, GI will eval in AM CT AP GIB scan was neg for acute bleeding source  Lumbago Injury seems to be either musculoskeletal vs vertebral disk. Doubt this directly related to his GIB today. No worrisome neurologic signs or changes. Needs MRI of L spine once stabilized from a GI bleed perspective.      Advance Care Planning:   Code Status: Full Code  Consults: Dr. Rollin  Family Communication: Family at bedside  Severity of Illness: The appropriate patient status for this patient is OBSERVATION. Observation status is judged to be reasonable and necessary in order to provide the required intensity of service to ensure the patient's safety. The patient's presenting symptoms, physical exam findings, and initial radiographic and laboratory data in the context of their medical condition is felt to place them at decreased risk for further clinical deterioration. Furthermore, it is anticipated that the patient will be medically stable for discharge from the hospital within 2 midnights of admission.   Author: Gregery Walberg M., DO 07/31/2023 4:17 AM  For on call review [email or web address].

## 2023-07-31 NOTE — Consult Note (Signed)
 Reason for Consult: Rectal bleeding. Referring Physician: [facility].  Christopher Massey is an 58 y.o. male.  HPI: Christopher Massey is a 58 year old black male with multiple medical problems listed below who presented to the [facility] emergency room with a history of rectal bleeding and a syncopal event. The bleeding started at about 6 PM last night. He denies having abdominal pain nausea vomiting.  There is no history of fever chills or rigors.  His last colonoscopy was done in September 2022 and was noted to have scattered diverticula in the transverse colon.  In the emergency room he had a CTA done that showed scattered diverticulosis with no obvious sign of active bleeding.  Hepatic steatosis was also recognized along with mild the enlarged prostate; punctate nonobstructing stone was noted in the left kidney. Patient had no further rectal bleeding since midnight.  He has a longstanding history of chronic constipation and averages 1 BM every 2 to 3 days.  He is hemoglobin drop from 16 g/dL on 02/25/7973 to 87.3 g/dL today. He denies the use of nonsteroidals.  Past Medical History:  Diagnosis Date   GERD (gastroesophageal reflux disease)    Gout    IBS (irritable bowel syndrome)    Past Surgical History:  Procedure Laterality Date   SHOULDER ARTHROSCOPY Bilateral    History reviewed. No pertinent family history.  Social History:  reports that he has never smoked. He has never used smokeless tobacco. He reports that he does not drink alcohol and does not use drugs.  Allergies: No Known Allergies  Medications: I have reviewed the patient's current medications. Prior to Admission: (Not in a hospital admission)  Scheduled: Continuous:  lactated ringers  125 mL/hr at 07/31/23 0446   PRN:acetaminophen  **OR** acetaminophen , ondansetron  **OR** ondansetron  (ZOFRAN ) IV Anti-infectives (From admission, onward)    None      Results for orders placed or performed during the hospital encounter  of 07/30/23 (from the past 48 hours)  CBC with Differential     Status: None   Collection Time: 07/30/23  7:10 PM  Result Value Ref Range   WBC 5.8 4.0 - 10.5 K/uL   RBC 5.23 4.22 - 5.81 MIL/uL   Hemoglobin 16.0 13.0 - 17.0 g/dL   HCT 53.3 60.9 - 47.9 %   MCV 89.1 80.0 - 100.0 fL   MCH 30.6 26.0 - 34.0 pg   MCHC 34.3 30.0 - 36.0 g/dL   RDW 86.5 88.4 - 84.4 %   Platelets 201 150 - 400 K/uL   nRBC 0.0 0.0 - 0.2 %   Neutrophils Relative % 54 %   Neutro Abs 3.1 1.7 - 7.7 K/uL   Lymphocytes Relative 39 %   Lymphs Abs 2.2 0.7 - 4.0 K/uL   Monocytes Relative 6 %   Monocytes Absolute 0.4 0.1 - 1.0 K/uL   Eosinophils Relative 1 %   Eosinophils Absolute 0.1 0.0 - 0.5 K/uL   Basophils Relative 0 %   Basophils Absolute 0.0 0.0 - 0.1 K/uL   Immature Granulocytes 0 %   Abs Immature Granulocytes 0.01 0.00 - 0.07 K/uL    Comment: Performed at [facility], [address] [place], [place] [address]  Comprehensive metabolic panel     Status: Abnormal   Collection Time: 07/30/23  7:10 PM  Result Value Ref Range   Sodium 139 135 - 145 mmol/L   Potassium 3.6 3.5 - 5.1 mmol/L   Chloride 102 98 - 111 mmol/L   CO2 29 22 - 32  mmol/L   Glucose, Bld 106 (H) 70 - 99 mg/dL    Comment: Glucose reference range applies only to samples taken after fasting for at least 8 hours.   BUN 14 6 - 20 mg/dL   Creatinine, Ser 8.88 0.61 - 1.24 mg/dL   Calcium 9.0 8.9 - 89.6 mg/dL   Total Protein 7.3 6.5 - 8.1 g/dL   Albumin 4.4 3.5 - 5.0 g/dL   AST 21 15 - 41 U/L   ALT 23 0 - 44 U/L   Alkaline Phosphatase 50 38 - 126 U/L   Total Bilirubin 0.5 0.0 - 1.2 mg/dL   GFR, Estimated >39 >39 mL/min    Comment: (NOTE) Calculated using the CKD-EPI Creatinine Equation (2021)    Anion gap 8 5 - 15    Comment: Performed at [facility], [address], [place], [place] [address]  Lipase, blood     Status: Abnormal   Collection Time: 07/30/23  7:10 PM  Result Value Ref Range    Lipase 66 (H) 11 - 51 U/L    Comment: Performed at [facility], [address] [place], [place] [address]  CBC     Status: Abnormal   Collection Time: 07/30/23  9:53 PM  Result Value Ref Range   WBC 10.7 (H) 4.0 - 10.5 K/uL   RBC 4.53 4.22 - 5.81 MIL/uL   Hemoglobin 13.9 13.0 - 17.0 g/dL   HCT 59.0 60.9 - 47.9 %   MCV 90.3 80.0 - 100.0 fL   MCH 30.7 26.0 - 34.0 pg   MCHC 34.0 30.0 - 36.0 g/dL   RDW 86.3 88.4 - 84.4 %   Platelets 188 150 - 400 K/uL   nRBC 0.0 0.0 - 0.2 %    Comment: Performed at [facility], [address], [place], [place] [address]  ABO/Rh     Status: None   Collection Time: 07/31/23  3:25 AM  Result Value Ref Range   ABO/RH(D)      B POS Performed at [facility], [address] [address] [place], [place] [address]   CBC     Status: None   Collection Time: 07/31/23  3:26 AM  Result Value Ref Range   WBC 8.6 4.0 - 10.5 K/uL   RBC 4.22 4.22 - 5.81 MIL/uL   Hemoglobin 13.0 13.0 - 17.0 g/dL   HCT 60.9 60.9 - 47.9 %   MCV 92.4 80.0 - 100.0 fL   MCH 30.8 26.0 - 34.0 pg   MCHC 33.3 30.0 - 36.0 g/dL   RDW 86.1 88.4 - 84.4 %   Platelets 177 150 - 400 K/uL   nRBC 0.0 0.0 - 0.2 %    Comment: Performed at [facility], [address] [place], [place] [address]  Basic metabolic panel     Status: Abnormal   Collection Time: 07/31/23  3:26 AM  Result Value Ref Range   Sodium 136 135 - 145 mmol/L   Potassium 5.0 3.5 - 5.1 mmol/L   Chloride 105 98 - 111 mmol/L   CO2 22 22 - 32 mmol/L   Glucose, Bld 153 (H) 70 - 99 mg/dL    Comment: Glucose reference range applies only to samples taken after fasting for at least 8 hours.   BUN 13 6 - 20 mg/dL   Creatinine, Ser 8.83 0.61 - 1.24 mg/dL   Calcium 8.4 (L) 8.9 - 10.3 mg/dL   GFR, Estimated >39 >39 mL/min    Comment: (  NOTE) Calculated using the CKD-EPI Creatinine Equation (2021)    Anion gap 9 5 - 15    Comment: Performed at [facility], [address]  [place], [place] [address]  Type and screen [facility]     Status: None   Collection Time: 07/31/23  4:45 AM  Result Value Ref Range   ABO/RH(D) B POS    Antibody Screen NEG    Sample Expiration      08/03/2023,2359 Performed at [facility], [address] [place], [place] [address]     CT ANGIO GI BLEED Result Date: 07/30/2023 CLINICAL DATA:  Left lower quadrant pain, back pain. Right red blood in stool. EXAM: CTA ABDOMEN AND PELVIS WITHOUT AND WITH CONTRAST TECHNIQUE: Multidetector CT imaging of the abdomen and pelvis was performed using the standard protocol during bolus administration of intravenous contrast. Multiplanar reconstructed images and MIPs were obtained and reviewed to evaluate the vascular anatomy. RADIATION DOSE REDUCTION: This exam was performed according to the departmental dose-optimization program which includes automated exposure control, adjustment of the mA and/or kV according to patient size and/or use of iterative reconstruction technique. CONTRAST:  OMNIPAQUE  IOHEXOL  350 MG/ML SOLN COMPARISON:  03/02/2021 FINDINGS: VASCULAR Aorta: Normal caliber aorta without aneurysm, dissection, vasculitis or significant stenosis. Celiac: Patent without evidence of aneurysm, dissection, vasculitis or significant stenosis. SMA: Patent without evidence of aneurysm, dissection, vasculitis or significant stenosis. Renals: Both renal arteries are patent without evidence of aneurysm, dissection, vasculitis, fibromuscular dysplasia or significant stenosis. IMA: Patent without evidence of aneurysm, dissection, vasculitis or significant stenosis. Inflow: Patent without evidence of aneurysm, dissection, vasculitis or significant stenosis. Proximal Outflow: Bilateral common femoral and visualized portions of the superficial and profunda femoral arteries are patent without evidence of aneurysm, dissection, vasculitis or significant stenosis. Veins: No obvious venous abnormality  within the limitations of this arterial phase study. Review of the MIP images confirms the above findings. NON-VASCULAR Lower chest: Linear atelectasis or scarring in the lower lobes. No effusions. Hepatobiliary: Diffuse low-density throughout the liver compatible with fatty infiltration. No focal abnormality. Gallbladder unremarkable. Pancreas: No focal abnormality or ductal dilatation. Spleen: No focal abnormality.  Normal size. Adrenals/Urinary Tract: Punctate left lower pole nephrolithiasis. No ureteral stones or hydronephrosis. Adrenal glands and urinary bladder unremarkable. Stomach/Bowel: Normal appendix. Few scattered colonic diverticula. No active diverticulitis. No contrast extravasation to localize GI bleed. Stomach and small bowel decompressed. Lymphatic: No adenopathy Reproductive: Mildly enlarged prostate. Other: No free fluid or free air. Musculoskeletal: No acute bony abnormality. IMPRESSION: VASCULAR No contrast extravasation to localize GI bleed. NON-VASCULAR Few scattered colonic diverticula.  No active diverticulitis. Left lower pole nephrolithiasis.  No hydronephrosis. Hepatic steatosis. Electronically Signed   By: Franky Crease M.D.   On: 07/30/2023 20:54    Review of Systems  Constitutional:  Positive for activity change and fatigue. Negative for appetite change, chills, diaphoresis, fever and unexpected weight change.  HENT: Negative.    Eyes: Negative.   Respiratory: Negative.    Cardiovascular: Negative.   Gastrointestinal:  Positive for anal bleeding, blood in stool and constipation. Negative for abdominal distention, abdominal pain, diarrhea and nausea.  Musculoskeletal:  Positive for arthralgias and back pain. Negative for gait problem, joint swelling and myalgias.  Allergic/Immunologic: Negative.   Neurological:  Positive for dizziness, syncope, weakness and light-headedness.  Hematological: Negative.   Psychiatric/Behavioral: Negative.     Blood pressure 123/86, pulse  94, temperature 98.7 F (37.1 C), resp. rate 20, height 6' 1 (  1.854 m), weight 109.8 kg, SpO2 100%. Physical Exam Vitals reviewed.  Constitutional:      General: He is not in acute distress.    Appearance: He is well-developed. He is obese. He is not toxic-appearing.  HENT:     Head: Normocephalic and atraumatic.  Eyes:     Extraocular Movements: Extraocular movements intact.     Pupils: Pupils are equal, round, and reactive to light.  Cardiovascular:     Rate and Rhythm: Normal rate and regular rhythm.     Heart sounds: Normal heart sounds.  Pulmonary:     Effort: Pulmonary effort is normal.     Breath sounds: Normal breath sounds.  Abdominal:     General: Bowel sounds are decreased. There is no distension.     Tenderness: There is no abdominal tenderness.  Skin:    General: Skin is warm and dry.  Neurological:     General: No focal deficit present.     Mental Status: He is alert and oriented to person, place, and time.   Assessment/Plan: 1) Acute anemia secondary to rectal bleeding from presumed diverticular disease with a negative CTA-will continue to monitor his blood counts closely and hydrate him adequately. 2) Chronic constipation-once of a high-fiber diet with liberal fluid intake and the use of stool softeners could have been discussed with him. 3) GERD-on Omeprazole at home. 4) Fatty infiltration of the liver. 5) Gout-on Allopurinol  at home. 6) Obesity  7) Back pain. Renaye Sous 07/31/2023, 8:47 AM

## 2023-07-31 NOTE — Hospital Course (Signed)
 58 y.o. M with hx low back pain who presented bright red rectal bleeding.

## 2023-07-31 NOTE — Progress Notes (Signed)
 Patient to [phone] at this time

## 2023-07-31 NOTE — ED Notes (Signed)
 Patient transported to CT via stretcher.

## 2023-07-31 NOTE — Assessment & Plan Note (Addendum)
 Started out bright red, then became maroon / dark.  No anticoagulation. Denies heavy NSAID use.   Hgb so far normal.  CTA negative for source - CLD - Trend Hgb - Consult GI, appreciate expertise

## 2023-07-31 NOTE — ED Notes (Signed)
Dr. Gardner at bedside 

## 2023-07-31 NOTE — ED Notes (Addendum)
 Provided pt with personal care items and supplies to bathe. Pt declines assistance and will call to ask for help as needed.

## 2023-08-01 DIAGNOSIS — K625 Hemorrhage of anus and rectum: Secondary | ICD-10-CM | POA: Diagnosis not present

## 2023-08-01 LAB — CBC
HCT: 30.2 % — ABNORMAL LOW (ref 39.0–52.0)
HCT: 31.5 % — ABNORMAL LOW (ref 39.0–52.0)
HCT: 32.1 % — ABNORMAL LOW (ref 39.0–52.0)
Hemoglobin: 10.3 g/dL — ABNORMAL LOW (ref 13.0–17.0)
Hemoglobin: 10.8 g/dL — ABNORMAL LOW (ref 13.0–17.0)
Hemoglobin: 10.8 g/dL — ABNORMAL LOW (ref 13.0–17.0)
MCH: 30.8 pg (ref 26.0–34.0)
MCH: 31 pg (ref 26.0–34.0)
MCH: 30.7 pg (ref 26.0–34.0)
MCHC: 34.1 g/dL (ref 30.0–36.0)
MCHC: 34.3 g/dL (ref 30.0–36.0)
MCHC: 33.6 g/dL (ref 30.0–36.0)
MCV: 90.4 fL (ref 80.0–100.0)
MCV: 90.5 fL (ref 80.0–100.0)
MCV: 91.2 fL (ref 80.0–100.0)
Platelets: 157 10*3/uL (ref 150–400)
Platelets: 172 10*3/uL (ref 150–400)
Platelets: 187 10*3/uL (ref 150–400)
RBC: 3.34 MIL/uL — ABNORMAL LOW (ref 4.22–5.81)
RBC: 3.48 MIL/uL — ABNORMAL LOW (ref 4.22–5.81)
RBC: 3.52 MIL/uL — ABNORMAL LOW (ref 4.22–5.81)
RDW: 13.9 % (ref 11.5–15.5)
RDW: 13.7 % (ref 11.5–15.5)
RDW: 13.7 % (ref 11.5–15.5)
WBC: 5.5 10*3/uL (ref 4.0–10.5)
WBC: 5.5 10*3/uL (ref 4.0–10.5)
WBC: 4.9 10*3/uL (ref 4.0–10.5)
nRBC: 0 % (ref 0.0–0.2)
nRBC: 0 % (ref 0.0–0.2)
nRBC: 0 % (ref 0.0–0.2)

## 2023-08-01 LAB — BASIC METABOLIC PANEL
Anion gap: 9 (ref 5–15)
BUN: 11 mg/dL (ref 6–20)
CO2: 27 mmol/L (ref 22–32)
Calcium: 8.6 mg/dL — ABNORMAL LOW (ref 8.9–10.3)
Chloride: 103 mmol/L (ref 98–111)
Creatinine, Ser: 1.06 mg/dL (ref 0.61–1.24)
GFR, Estimated: >60 mL/min (ref >60)
Glucose, Bld: 107 mg/dL — ABNORMAL HIGH (ref 70–99)
Potassium: 3.5 mmol/L (ref 3.5–5.1)
Sodium: 139 mmol/L (ref 135–145)

## 2023-08-01 MED ORDER — GABAPENTIN 300 MG PO CAPS: 300.0000 mg | ORAL_CAPSULE | Freq: Every day | ORAL | Status: DC | PRN

## 2023-08-01 MED ORDER — PANTOPRAZOLE SODIUM 40 MG PO TBEC
40.0000 mg | DELAYED_RELEASE_TABLET | Freq: Every day | ORAL | Status: DC
  Administered 2023-08-01 – 2023-08-04 (×4): 40 mg via ORAL
  Filled 2023-08-01 (×4): qty 1

## 2023-08-01 MED ORDER — OXYBUTYNIN CHLORIDE ER 10 MG PO TB24
10.0000 mg | ORAL_TABLET | Freq: Every day | ORAL | Status: DC
  Administered 2023-08-01 – 2023-08-03 (×3): 10 mg via ORAL
  Filled 2023-08-01 (×3): qty 1

## 2023-08-01 MED ORDER — ALLOPURINOL 300 MG PO TABS
300.0000 mg | ORAL_TABLET | Freq: Every day | ORAL | Status: DC
  Administered 2023-08-01 – 2023-08-04 (×4): 300 mg via ORAL
  Filled 2023-08-01 (×4): qty 1

## 2023-08-01 MED ORDER — TAMSULOSIN HCL 0.4 MG PO CAPS
0.4000 mg | ORAL_CAPSULE | Freq: Every day | ORAL | Status: DC
  Administered 2023-08-01 – 2023-08-04 (×4): 0.4 mg via ORAL
  Filled 2023-08-01 (×4): qty 1

## 2023-08-01 MED ORDER — POTASSIUM CHLORIDE CRYS ER 20 MEQ PO TBCR
40.0000 meq | EXTENDED_RELEASE_TABLET | Freq: Once | ORAL | Status: AC
  Administered 2023-08-01: 40 meq via ORAL
  Filled 2023-08-01: qty 2

## 2023-08-01 NOTE — Plan of Care (Signed)

## 2023-08-01 NOTE — TOC CM/SW Note (Signed)
 Transition of Care [facility]) - Inpatient Brief Assessment   Patient Details  Name: Christopher Massey MRN: [id number] Date of Birth: April 02, 1966  Transition of Care [facility]) CM/SW Contact:    Inocente GORMAN Kindle, LCSW Phone Number: 08/01/2023, 4:04 PM   Clinical Narrative: Patient admitted from home with spouse undergoing workup for GI Bleed. Please place [facility] consult if needs arise.    Transition of Care Asessment: Insurance and Status: Insurance coverage has been reviewed Patient has primary care physician: Yes Home environment has been reviewed: From home Prior level of function:: Independent Prior/Current Home Services: No current home services Social Drivers of Health Review: SDOH reviewed no interventions necessary Readmission risk has been reviewed: Yes Transition of care needs: no transition of care needs at this time

## 2023-08-01 NOTE — Progress Notes (Signed)
 PROGRESS NOTE                                                                                                                                                                                                             Patient Demographics:    Christopher Massey, is a 58 y.o. male, DOB - [birth year], FMW:[id number]  Outpatient Primary MD for the patient is [name]., MD    LOS - 1  Admit date - 07/30/2023    Chief Complaint  Patient presents with   Rectal Bleeding   Abdominal Pain       Brief Narrative (HPI from H&P)   58 y.o. male with medical history significant of GERD, gout, cervical spinal disk disease.   Pt with low back pain ever since he bent over to pick something up last week.   No pain shooting down legs, no leg weakness, numbness, saddle anesthesia, nor increased difficulty urinating from baseline (pt also has BPH though).   Pt taking mostly tylenol  and prescribed Neurontin  for back pain, not really taking OTC NSAIDS.   Today pt had sudden onset of GIB: started out as BRBPR before becoming dark / maroon in color.  Blood loss was pretty severe at first, now slowed down.   Subjective:    [name] today has, No headache, No chest pain, No abdominal pain - No Nausea, No new weakness tingling or numbness, no shortness of breath, no further bowel movements since he came to the hospital.   Assessment  & Plan :    Acute blood loss anemia due to rectal bleeding -spacious for diverticular bleed, significant drop in H&H, no further ongoing bleeding right now but CBC suggestive of continued low-grade oozing, CTA negative, currently on clear liquid diet and oral PPI, no abdominal pain or cramping no fevers.  Case discussed with GI physician Dr. Kristie, patient had a syncopal episode in the ER due to significant drop in H&H and this continues to drop, continue to monitor.  Most likely will get colonoscopy tomorrow.   Continue on clears.  Type screen has been done.  Low-grade lumbago, likely due to muscle spasm No red flag symptoms. - Follow up with PCP, currently no pain.  BPH.  Home medications continued.         Condition - Extremely Guarded  Family Communication  :  Wife bedside on 08/01/2023  Code Status : Full code  Consults  : GI  PUD Prophylaxis : PPI   Procedures  :     CTA - No contrast extravasation to localize GI bleed. NON-VASCULAR Few scattered colonic diverticula.  No active diverticulitis. Left lower pole nephrolithiasis.  No hydronephrosis. Hepatic steatosis.      Disposition Plan  :    Status is: Inpatient   DVT Prophylaxis  :    SCDs Start: 07/31/23 0305    Lab Results  Component Value Date   PLT 157 08/01/2023    Diet :  Diet Order             Diet clear liquid Room service appropriate? Yes; Fluid consistency: Thin  Diet effective now                    Inpatient Medications  Scheduled Meds:  allopurinol   300 mg Oral Daily   oxybutynin   10 mg Oral QHS   pantoprazole   40 mg Oral Daily   potassium chloride   40 mEq Oral Once   tamsulosin   0.4 mg Oral Daily   Continuous Infusions: PRN Meds:.acetaminophen  **OR** acetaminophen , gabapentin , ondansetron  **OR** ondansetron  (ZOFRAN ) IV  Antibiotics  :    Anti-infectives (From admission, onward)    None         Objective:   Vitals:   07/31/23 2000 08/01/23 0040 08/01/23 0509 08/01/23 0850  BP: 130/76   116/75  Pulse: 94   91  Resp: 15   17  Temp: 98 F (36.7 C) 98.2 F (36.8 C) 98.1 F (36.7 C) 98.1 F (36.7 C)  TempSrc: Oral Oral Oral Oral  SpO2:    94%  Weight:      Height:        Wt Readings from Last 3 Encounters:  07/31/23 109.8 kg  03/02/21 109.8 kg  07/30/17 110.2 kg     Intake/Output Summary (Last 24 hours) at 08/01/2023 1007 Last data filed at 08/01/2023 0850 Gross per 24 hour  Intake --  Output 350 ml  Net -350 ml     Physical Exam  Awake Alert, No new F.N  deficits, Normal affect [place].AT,PERRAL Supple Neck, No JVD,   Symmetrical Chest wall movement, Good air movement bilaterally, CTAB RRR,No Gallops,Rubs or new Murmurs,  +ve B.Sounds, Abd Soft, No tenderness,   No Cyanosis, Clubbing or edema      Data Review:    Recent Labs  Lab 07/30/23 1910 07/30/23 2153 07/31/23 0326 07/31/23 1251 08/01/23 0435  WBC 5.8 10.7* 8.6  --  5.5  HGB 16.0 13.9 13.0 12.6* 10.3*  HCT 46.6 40.9 39.0 37.5* 30.2*  PLT 201 188 177  --  157  MCV 89.1 90.3 92.4  --  90.4  MCH 30.6 30.7 30.8  --  30.8  MCHC 34.3 34.0 33.3  --  34.1  RDW 13.4 13.6 13.8  --  13.9  LYMPHSABS 2.2  --   --   --   --   MONOABS 0.4  --   --   --   --   EOSABS 0.1  --   --   --   --   BASOSABS 0.0  --   --   --   --     Recent Labs  Lab 07/30/23 1910 07/31/23 0326 08/01/23 0435  NA 139 136 139  K 3.6 5.0 3.5  CL 102 105 103  CO2 29 22 27   ANIONGAP 8  9 9  GLUCOSE 106* 153* 107*  BUN 14 13 11   CREATININE 1.11 1.16 1.06  AST 21  --   --   ALT 23  --   --   ALKPHOS 50  --   --   BILITOT 0.5  --   --   ALBUMIN 4.4  --   --   CALCIUM 9.0 8.4* 8.6*      Recent Labs  Lab 07/30/23 1910 07/31/23 0326 08/01/23 0435  CALCIUM 9.0 8.4* 8.6*    --------------------------------------------------------------------------------------------------------------- Lab Results  Component Value Date   CHOL 173 01/22/2010   HDL 43.40 01/22/2010   LDLCALC 106 (H) 01/22/2010   TRIG 119.0 01/22/2010   CHOLHDL 4 01/22/2010      Radiology Reports CT ANGIO GI BLEED Result Date: 07/30/2023 CLINICAL DATA:  Left lower quadrant pain, back pain. Right red blood in stool. EXAM: CTA ABDOMEN AND PELVIS WITHOUT AND WITH CONTRAST TECHNIQUE: Multidetector CT imaging of the abdomen and pelvis was performed using the standard protocol during bolus administration of intravenous contrast. Multiplanar reconstructed images and MIPs were obtained and reviewed to evaluate the vascular anatomy. RADIATION  DOSE REDUCTION: This exam was performed according to the departmental dose-optimization program which includes automated exposure control, adjustment of the mA and/or kV according to patient size and/or use of iterative reconstruction technique. CONTRAST:  OMNIPAQUE  IOHEXOL  350 MG/ML SOLN COMPARISON:  03/02/2021 FINDINGS: VASCULAR Aorta: Normal caliber aorta without aneurysm, dissection, vasculitis or significant stenosis. Celiac: Patent without evidence of aneurysm, dissection, vasculitis or significant stenosis. SMA: Patent without evidence of aneurysm, dissection, vasculitis or significant stenosis. Renals: Both renal arteries are patent without evidence of aneurysm, dissection, vasculitis, fibromuscular dysplasia or significant stenosis. IMA: Patent without evidence of aneurysm, dissection, vasculitis or significant stenosis. Inflow: Patent without evidence of aneurysm, dissection, vasculitis or significant stenosis. Proximal Outflow: Bilateral common femoral and visualized portions of the superficial and profunda femoral arteries are patent without evidence of aneurysm, dissection, vasculitis or significant stenosis. Veins: No obvious venous abnormality within the limitations of this arterial phase study. Review of the MIP images confirms the above findings. NON-VASCULAR Lower chest: Linear atelectasis or scarring in the lower lobes. No effusions. Hepatobiliary: Diffuse low-density throughout the liver compatible with fatty infiltration. No focal abnormality. Gallbladder unremarkable. Pancreas: No focal abnormality or ductal dilatation. Spleen: No focal abnormality.  Normal size. Adrenals/Urinary Tract: Punctate left lower pole nephrolithiasis. No ureteral stones or hydronephrosis. Adrenal glands and urinary bladder unremarkable. Stomach/Bowel: Normal appendix. Few scattered colonic diverticula. No active diverticulitis. No contrast extravasation to localize GI bleed. Stomach and small bowel decompressed.  Lymphatic: No adenopathy Reproductive: Mildly enlarged prostate. Other: No free fluid or free air. Musculoskeletal: No acute bony abnormality. IMPRESSION: VASCULAR No contrast extravasation to localize GI bleed. NON-VASCULAR Few scattered colonic diverticula.  No active diverticulitis. Left lower pole nephrolithiasis.  No hydronephrosis. Hepatic steatosis. Electronically Signed   By: Franky Crease M.D.   On: 07/30/2023 20:54      Signature  -   Lavada Stank M.D on 08/01/2023 at 10:07 AM   -  To page go to [email or web address]

## 2023-08-01 NOTE — Progress Notes (Signed)
 Subjective: No acute events.  Objective: Vital signs in last 24 hours: Temp:  [98 F (36.7 C)-98.2 F (36.8 C)] 98.1 F (36.7 C) (01/07 1258) Pulse Rate:  [87-94] 87 (01/07 1258) Resp:  [14-18] 18 (01/07 1258) BP: (104-130)/(66-94) 110/76 (01/07 1258) SpO2:  [94 %-100 %] 95 % (01/07 1258) Last BM Date : 07/31/23  Intake/Output from previous day: No intake/output data recorded. Intake/Output this shift: Total I/O In: -  Out: 690 [Urine:690]  General appearance: alert and no distress GI: soft, non-tender; bowel sounds normal; no masses,  no organomegaly  Lab Results: Recent Labs    07/30/23 2153 07/31/23 0326 07/31/23 1251 08/01/23 0435  WBC 10.7* 8.6  --  5.5  HGB 13.9 13.0 12.6* 10.3*  HCT 40.9 39.0 37.5* 30.2*  PLT 188 177  --  157   BMET Recent Labs    07/30/23 1910 07/31/23 0326 08/01/23 0435  NA 139 136 139  K 3.6 5.0 3.5  CL 102 105 103  CO2 29 22 27   GLUCOSE 106* 153* 107*  BUN 14 13 11   CREATININE 1.11 1.16 1.06  CALCIUM 9.0 8.4* 8.6*   LFT Recent Labs    07/30/23 1910  PROT 7.3  ALBUMIN 4.4  AST 21  ALT 23  ALKPHOS 50  BILITOT 0.5   PT/INR No results for input(s): LABPROT, INR in the last 72 hours. Hepatitis Panel No results for input(s): HEPBSAG, HCVAB, HEPAIGM, HEPBIGM in the last 72 hours. C-Diff No results for input(s): CDIFFTOX in the last 72 hours. Fecal Lactopherrin No results for input(s): FECLLACTOFRN in the last 72 hours.  Studies/Results: CT ANGIO GI BLEED Result Date: 07/30/2023 CLINICAL DATA:  Left lower quadrant pain, back pain. Right red blood in stool. EXAM: CTA ABDOMEN AND PELVIS WITHOUT AND WITH CONTRAST TECHNIQUE: Multidetector CT imaging of the abdomen and pelvis was performed using the standard protocol during bolus administration of intravenous contrast. Multiplanar reconstructed images and MIPs were obtained and reviewed to evaluate the vascular anatomy. RADIATION DOSE REDUCTION: This exam was  performed according to the departmental dose-optimization program which includes automated exposure control, adjustment of the mA and/or kV according to patient size and/or use of iterative reconstruction technique. CONTRAST:  OMNIPAQUE  IOHEXOL  350 MG/ML SOLN COMPARISON:  03/02/2021 FINDINGS: VASCULAR Aorta: Normal caliber aorta without aneurysm, dissection, vasculitis or significant stenosis. Celiac: Patent without evidence of aneurysm, dissection, vasculitis or significant stenosis. SMA: Patent without evidence of aneurysm, dissection, vasculitis or significant stenosis. Renals: Both renal arteries are patent without evidence of aneurysm, dissection, vasculitis, fibromuscular dysplasia or significant stenosis. IMA: Patent without evidence of aneurysm, dissection, vasculitis or significant stenosis. Inflow: Patent without evidence of aneurysm, dissection, vasculitis or significant stenosis. Proximal Outflow: Bilateral common femoral and visualized portions of the superficial and profunda femoral arteries are patent without evidence of aneurysm, dissection, vasculitis or significant stenosis. Veins: No obvious venous abnormality within the limitations of this arterial phase study. Review of the MIP images confirms the above findings. NON-VASCULAR Lower chest: Linear atelectasis or scarring in the lower lobes. No effusions. Hepatobiliary: Diffuse low-density throughout the liver compatible with fatty infiltration. No focal abnormality. Gallbladder unremarkable. Pancreas: No focal abnormality or ductal dilatation. Spleen: No focal abnormality.  Normal size. Adrenals/Urinary Tract: Punctate left lower pole nephrolithiasis. No ureteral stones or hydronephrosis. Adrenal glands and urinary bladder unremarkable. Stomach/Bowel: Normal appendix. Few scattered colonic diverticula. No active diverticulitis. No contrast extravasation to localize GI bleed. Stomach and small bowel decompressed. Lymphatic: No adenopathy  Reproductive: Mildly enlarged prostate.  Other: No free fluid or free air. Musculoskeletal: No acute bony abnormality. IMPRESSION: VASCULAR No contrast extravasation to localize GI bleed. NON-VASCULAR Few scattered colonic diverticula.  No active diverticulitis. Left lower pole nephrolithiasis.  No hydronephrosis. Hepatic steatosis. Electronically Signed   By: Franky Crease M.D.   On: 07/30/2023 20:54    Medications: Scheduled:  allopurinol   300 mg Oral Daily   oxybutynin   10 mg Oral QHS   pantoprazole   40 mg Oral Daily   tamsulosin   0.4 mg Oral Daily   Continuous:  Assessment/Plan: 1) Probable diverticular bleed. 2) Anemia.   The patient remains stable, even though there was a drop in his HGB.  The presentation is consistent with a diverticular bleed.  The CTA was negative for any bleeding source.  Plan: 1) Follow HGB. 2) Transfuse if necessary. 3) If no further bleeding and HGB stable, he can be discharged home tomorrow.  LOS: 1 day   Jaan Fischel D 08/01/2023, 1:36 PM

## 2023-08-02 DIAGNOSIS — K625 Hemorrhage of anus and rectum: Secondary | ICD-10-CM | POA: Diagnosis not present

## 2023-08-02 LAB — CBC WITH DIFFERENTIAL/PLATELET
Abs Immature Granulocytes: 0.01 10*3/uL (ref 0.00–0.07)
Basophils Absolute: 0 10*3/uL (ref 0.0–0.1)
Basophils Relative: 1 %
Eosinophils Absolute: 0.1 10*3/uL (ref 0.0–0.5)
Eosinophils Relative: 2 %
HCT: 27.7 % — ABNORMAL LOW (ref 39.0–52.0)
Hemoglobin: 9.6 g/dL — ABNORMAL LOW (ref 13.0–17.0)
Immature Granulocytes: 0 %
Lymphocytes Relative: 37 %
Lymphs Abs: 1.6 10*3/uL (ref 0.7–4.0)
MCH: 31.1 pg (ref 26.0–34.0)
MCHC: 34.7 g/dL (ref 30.0–36.0)
MCV: 89.6 fL (ref 80.0–100.0)
Monocytes Absolute: 0.3 10*3/uL (ref 0.1–1.0)
Monocytes Relative: 7 %
Neutro Abs: 2.3 10*3/uL (ref 1.7–7.7)
Neutrophils Relative %: 53 %
Platelets: 158 10*3/uL (ref 150–400)
RBC: 3.09 MIL/uL — ABNORMAL LOW (ref 4.22–5.81)
RDW: 13.6 % (ref 11.5–15.5)
WBC: 4.3 10*3/uL (ref 4.0–10.5)
nRBC: 0 % (ref 0.0–0.2)

## 2023-08-02 LAB — CBC
HCT: 30.1 % — ABNORMAL LOW (ref 39.0–52.0)
Hemoglobin: 10.3 g/dL — ABNORMAL LOW (ref 13.0–17.0)
MCH: 31.3 pg (ref 26.0–34.0)
MCHC: 34.2 g/dL (ref 30.0–36.0)
MCV: 91.5 fL (ref 80.0–100.0)
Platelets: 186 10*3/uL (ref 150–400)
RBC: 3.29 MIL/uL — ABNORMAL LOW (ref 4.22–5.81)
RDW: 13.6 % (ref 11.5–15.5)
WBC: 4.5 10*3/uL (ref 4.0–10.5)
nRBC: 0 % (ref 0.0–0.2)

## 2023-08-02 LAB — BASIC METABOLIC PANEL
Anion gap: 7 (ref 5–15)
BUN: 10 mg/dL (ref 6–20)
CO2: 26 mmol/L (ref 22–32)
Calcium: 8.3 mg/dL — ABNORMAL LOW (ref 8.9–10.3)
Chloride: 104 mmol/L (ref 98–111)
Creatinine, Ser: 1.12 mg/dL (ref 0.61–1.24)
GFR, Estimated: >60 mL/min (ref >60)
Glucose, Bld: 99 mg/dL (ref 70–99)
Potassium: 3.6 mmol/L (ref 3.5–5.1)
Sodium: 137 mmol/L (ref 135–145)

## 2023-08-02 LAB — MAGNESIUM: Magnesium: 1.9 mg/dL (ref 1.7–2.4)

## 2023-08-02 MED ORDER — PEG 3350-KCL-NA BICARB-NACL 420 G PO SOLR
4000.0000 mL | Freq: Once | ORAL | Status: AC
  Administered 2023-08-02: 4000 mL via ORAL
  Filled 2023-08-02: qty 4000

## 2023-08-02 NOTE — Progress Notes (Signed)
 Mobility Specialist Progress Note;   08/02/23 1455  Mobility  Activity Ambulated independently in hallway  Level of Assistance Modified independent, requires aide device or extra time  Assistive Device None  Distance Ambulated (ft) 500 ft  Activity Response Tolerated well  Mobility Referral Yes  Mobility visit 1 Mobility  Mobility Specialist Start Time (ACUTE ONLY) 1455  Mobility Specialist Stop Time (ACUTE ONLY) 1508  Mobility Specialist Time Calculation (min) (ACUTE ONLY) 13 min   RN requested I walk w/ pt. Pt agreeable to mobility. Required no physical assistance during ambulation. VSS throughout and no c/o during session. Pt returned back to EOB with all needs met. Wife in room.   Lauraine Erm Mobility Specialist Please contact via SecureChat or [employer] [phone]

## 2023-08-02 NOTE — Progress Notes (Signed)
 PROGRESS NOTE                                                                                                                                                                                                             Patient Demographics:    Christopher Massey, is a 58 y.o. male, DOB - July 18, 1966, FMW:[id number]  Outpatient Primary MD for the patient is [name]., MD    LOS - 2  Admit date - 07/30/2023    Chief Complaint  Patient presents with   Rectal Bleeding   Abdominal Pain       Brief Narrative (HPI from H&P)   58 y.o. male with medical history significant of GERD, gout, cervical spinal disk disease.   Pt with low back pain ever since he bent over to pick something up last week.   No pain shooting down legs, no leg weakness, numbness, saddle anesthesia, nor increased difficulty urinating from baseline (pt also has BPH though).   Pt taking mostly tylenol  and prescribed Neurontin  for back pain, not really taking OTC NSAIDS.   Today pt had sudden onset of GIB: started out as BRBPR before becoming dark / maroon in color.  Blood loss was pretty severe at first, now slowed down.   Subjective:    [name] had another episode of bright red blood per rectum this morning, had another episode as well yesterday afternoon.  .    Assessment  & Plan :    Acute blood loss anemia due to rectal bleeding, suspicious for diverticular bleed. - significant drop in H&H, down to 9.6 this morning, continue to monitor H&H and transfuse as needed.  . - CTA negative. -Patient had another 2 episodes with bright red blood per rectum yesterday and today, plan for colonoscopy tomorrow, continue to monitor CBC and transfuse as needed. -  Continue on clears.  - Type screen has been done.  Low-grade lumbago, likely due to muscle spasm No red flag symptoms. - Follow up with PCP, currently no pain.  BPH.  Home medications  continued.         Condition - Extremely Guarded  Family Communication  : Wife bedside on 08/02/2023  Code Status : Full code  Consults  : GI  PUD Prophylaxis : PPI   Procedures  :     CTA -  No contrast extravasation to localize GI bleed. NON-VASCULAR Few scattered colonic diverticula.  No active diverticulitis. Left lower pole nephrolithiasis.  No hydronephrosis. Hepatic steatosis.      Disposition Plan  :    Status is: Inpatient   DVT Prophylaxis  :    Place and maintain sequential compression device Start: 08/01/23 2233 SCDs Start: 07/31/23 0305    Lab Results  Component Value Date   PLT 158 08/02/2023    Diet :  Diet Order             Diet NPO time specified  Diet effective midnight           Diet clear liquid Room service appropriate? Yes; Fluid consistency: Thin  Diet effective now                    Inpatient Medications  Scheduled Meds:  allopurinol   300 mg Oral Daily   oxybutynin   10 mg Oral QHS   pantoprazole   40 mg Oral Daily   polyethylene glycol-electrolytes  4,000 mL Oral Once   tamsulosin   0.4 mg Oral Daily   Continuous Infusions: PRN Meds:.acetaminophen  **OR** acetaminophen , gabapentin , ondansetron  **OR** ondansetron  (ZOFRAN ) IV  Antibiotics  :    Anti-infectives (From admission, onward)    None         Objective:   Vitals:   08/02/23 0400 08/02/23 0800 08/02/23 0849 08/02/23 1248  BP: 108/73 127/73 120/84 124/86  Pulse: 70  75 76  Resp: 14 12 (!) 24 (!) 21  Temp: 98.3 F (36.8 C)  98 F (36.7 C) 98.1 F (36.7 C)  TempSrc: Oral  Oral Oral  SpO2: 93% (!) 88% 90%   Weight:      Height:        Wt Readings from Last 3 Encounters:  07/31/23 109.8 kg  03/02/21 109.8 kg  07/30/17 110.2 kg     Intake/Output Summary (Last 24 hours) at 08/02/2023 1400 Last data filed at 08/02/2023 0850 Gross per 24 hour  Intake --  Output 1555 ml  Net -1555 ml     Physical Exam  Awake Alert, Oriented X 3, No new F.N  deficits, Normal affect Symmetrical Chest wall movement, Good air movement bilaterally, CTAB RRR,No Gallops,Rubs or new Murmurs, No Parasternal Heave +ve B.Sounds, Abd Soft, No tenderness, No rebound - guarding or rigidity. No Cyanosis, Clubbing or edema, No new Rash or bruise        Data Review:    Recent Labs  Lab 07/30/23 1910 07/30/23 2153 07/31/23 0326 07/31/23 1251 08/01/23 0435 08/01/23 1429 08/01/23 2005 08/02/23 0424  WBC 5.8   < > 8.6  --  5.5 5.5 4.9 4.3  HGB 16.0   < > 13.0 12.6* 10.3* 10.8* 10.8* 9.6*  HCT 46.6   < > 39.0 37.5* 30.2* 31.5* 32.1* 27.7*  PLT 201   < > 177  --  157 172 187 158  MCV 89.1   < > 92.4  --  90.4 90.5 91.2 89.6  MCH 30.6   < > 30.8  --  30.8 31.0 30.7 31.1  MCHC 34.3   < > 33.3  --  34.1 34.3 33.6 34.7  RDW 13.4   < > 13.8  --  13.9 13.7 13.7 13.6  LYMPHSABS 2.2  --   --   --   --   --   --  1.6  MONOABS 0.4  --   --   --   --   --   --  0.3  EOSABS 0.1  --   --   --   --   --   --  0.1  BASOSABS 0.0  --   --   --   --   --   --  0.0   < > = values in this interval not displayed.    Recent Labs  Lab 07/30/23 1910 07/31/23 0326 08/01/23 0435 08/02/23 0424  NA 139 136 139 137  K 3.6 5.0 3.5 3.6  CL 102 105 103 104  CO2 29 22 27 26   ANIONGAP 8 9 9 7   GLUCOSE 106* 153* 107* 99  BUN 14 13 11 10   CREATININE 1.11 1.16 1.06 1.12  AST 21  --   --   --   ALT 23  --   --   --   ALKPHOS 50  --   --   --   BILITOT 0.5  --   --   --   ALBUMIN 4.4  --   --   --   MG  --   --   --  1.9  CALCIUM 9.0 8.4* 8.6* 8.3*      Recent Labs  Lab 07/30/23 1910 07/31/23 0326 08/01/23 0435 08/02/23 0424  MG  --   --   --  1.9  CALCIUM 9.0 8.4* 8.6* 8.3*    --------------------------------------------------------------------------------------------------------------- Lab Results  Component Value Date   CHOL 173 01/22/2010   HDL 43.40 01/22/2010   LDLCALC 106 (H) 01/22/2010   TRIG 119.0 01/22/2010   CHOLHDL 4 01/22/2010       Radiology Reports CT ANGIO GI BLEED Result Date: 07/30/2023 CLINICAL DATA:  Left lower quadrant pain, back pain. Right red blood in stool. EXAM: CTA ABDOMEN AND PELVIS WITHOUT AND WITH CONTRAST TECHNIQUE: Multidetector CT imaging of the abdomen and pelvis was performed using the standard protocol during bolus administration of intravenous contrast. Multiplanar reconstructed images and MIPs were obtained and reviewed to evaluate the vascular anatomy. RADIATION DOSE REDUCTION: This exam was performed according to the departmental dose-optimization program which includes automated exposure control, adjustment of the mA and/or kV according to patient size and/or use of iterative reconstruction technique. CONTRAST:  OMNIPAQUE  IOHEXOL  350 MG/ML SOLN COMPARISON:  03/02/2021 FINDINGS: VASCULAR Aorta: Normal caliber aorta without aneurysm, dissection, vasculitis or significant stenosis. Celiac: Patent without evidence of aneurysm, dissection, vasculitis or significant stenosis. SMA: Patent without evidence of aneurysm, dissection, vasculitis or significant stenosis. Renals: Both renal arteries are patent without evidence of aneurysm, dissection, vasculitis, fibromuscular dysplasia or significant stenosis. IMA: Patent without evidence of aneurysm, dissection, vasculitis or significant stenosis. Inflow: Patent without evidence of aneurysm, dissection, vasculitis or significant stenosis. Proximal Outflow: Bilateral common femoral and visualized portions of the superficial and profunda femoral arteries are patent without evidence of aneurysm, dissection, vasculitis or significant stenosis. Veins: No obvious venous abnormality within the limitations of this arterial phase study. Review of the MIP images confirms the above findings. NON-VASCULAR Lower chest: Linear atelectasis or scarring in the lower lobes. No effusions. Hepatobiliary: Diffuse low-density throughout the liver compatible with fatty infiltration. No  focal abnormality. Gallbladder unremarkable. Pancreas: No focal abnormality or ductal dilatation. Spleen: No focal abnormality.  Normal size. Adrenals/Urinary Tract: Punctate left lower pole nephrolithiasis. No ureteral stones or hydronephrosis. Adrenal glands and urinary bladder unremarkable. Stomach/Bowel: Normal appendix. Few scattered colonic diverticula. No active diverticulitis. No contrast extravasation to localize GI bleed. Stomach and small bowel decompressed. Lymphatic: No adenopathy Reproductive: Mildly enlarged prostate. Other: No  free fluid or free air. Musculoskeletal: No acute bony abnormality. IMPRESSION: VASCULAR No contrast extravasation to localize GI bleed. NON-VASCULAR Few scattered colonic diverticula.  No active diverticulitis. Left lower pole nephrolithiasis.  No hydronephrosis. Hepatic steatosis. Electronically Signed   By: Franky Crease M.D.   On: 07/30/2023 20:54      Signature  -   Brayton Lye M.D on 08/02/2023 at 2:00 PM   -  To page go to [email or web address]

## 2023-08-02 NOTE — Plan of Care (Signed)

## 2023-08-02 NOTE — H&P (View-Only) (Signed)
 Subjective: Patient continues to have rectal bleeding with a significant drop in his hemoglobin from 16 g/dL on admission to 9.6 g/dL today.  He had a bloody bowel movement earlier today followed by some nausea after the bowel movement but denies having any near syncopal or syncopal events.  He denies having any abdominal pain.  CT on admission did not show any extravasation or active signs of bleeding.  Had fatty liver was noted.  Objective: Vital signs in last 24 hours: Temp:  [97.9 F (36.6 C)-98.5 F (36.9 C)] 98 F (36.7 C) (01/08 0849) Pulse Rate:  [68-87] 75 (01/08 0849) Resp:  [12-24] 24 (01/08 0849) BP: (108-127)/(72-84) 120/84 (01/08 0849) SpO2:  [88 %-96 %] 90 % (01/08 0849) Last BM Date : 08/01/23  Intake/Output from previous day: 01/07 0701 - 01/08 0700 In: -  Out: 1845 [Urine:1845] Intake/Output this shift: Total I/O In: -  Out: 400 [Urine:400]  General appearance: alert, cooperative, appears stated age, and no distress Resp: clear to auscultation bilaterally Cardio: regular rate and rhythm, S1, S2 normal, no murmur, click, rub or gallop GI: soft, non-tender; bowel sounds normal; no masses,  no organomegaly  Lab Results: Recent Labs    08/01/23 1429 08/01/23 2005 08/02/23 0424  WBC 5.5 4.9 4.3  HGB 10.8* 10.8* 9.6*  HCT 31.5* 32.1* 27.7*  PLT 172 187 158   BMET Recent Labs    07/31/23 0326 08/01/23 0435 08/02/23 0424  NA 136 139 137  K 5.0 3.5 3.6  CL 105 103 104  CO2 22 27 26   GLUCOSE 153* 107* 99  BUN 13 11 10   CREATININE 1.16 1.06 1.12  CALCIUM 8.4* 8.6* 8.3*   LFT Recent Labs    07/30/23 1910  PROT 7.3  ALBUMIN 4.4  AST 21  ALT 23  ALKPHOS 50  BILITOT 0.5   Studies/Results: No results found.  Medications: I have reviewed the patient's current medications. Prior to Admission:  Medications Prior to Admission  Medication Sig Dispense Refill Last Dose/Taking   allopurinol  (ZYLOPRIM ) 300 MG tablet Take 300 mg by mouth daily.   Past  Week   gabapentin  (NEURONTIN ) 300 MG capsule Take 300 mg by mouth daily as needed (for nerve pain).   07/26/2023   omeprazole (PRILOSEC) 20 MG capsule Take 20 mg by mouth daily as needed (for heartburn).   Past Week   oxybutynin  (DITROPAN -XL) 10 MG 24 hr tablet TAKE ONE TABLET BY MOUTH DAILY FOR OVERACTIVE BLADDER   Past Week   tamsulosin  (FLOMAX ) 0.4 MG CAPS capsule Take 0.4 mg by mouth daily.   Past Week   Scheduled:  allopurinol   300 mg Oral Daily   oxybutynin   10 mg Oral QHS   pantoprazole   40 mg Oral Daily   polyethylene glycol-electrolytes  4,000 mL Oral Once   tamsulosin   0.4 mg Oral Daily   Continuous: PRN:acetaminophen  **OR** acetaminophen , gabapentin , ondansetron  **OR** ondansetron  (ZOFRAN ) IV  Assessment/Plan: 1) Acute anemia secondary to rectal bleeding from presumed diverticular disease with a negative CTA-continue to monitor his blood counts closely-colonoscopy planned for tomorrow. 2) Chronic constipation-a high-fiber diet with liberal fluid intake and the use of stool softeners could have been discussed with him post discharge. 3) GERD-on Omeprazole at home. 4) Fatty infiltration of the liver. 5) Gout-on Allopurinol  at home. 6) Obesity  7) Back pain.  LOS: 2 days   Renaye Sous 08/02/2023, 12:40 PM

## 2023-08-02 NOTE — Progress Notes (Signed)
 Subjective: Patient continues to have rectal bleeding with a significant drop in his hemoglobin from 16 g/dL on admission to 9.6 g/dL today.  He had a bloody bowel movement earlier today followed by some nausea after the bowel movement but denies having any near syncopal or syncopal events.  He denies having any abdominal pain.  CT on admission did not show any extravasation or active signs of bleeding.  Had fatty liver was noted.  Objective: Vital signs in last 24 hours: Temp:  [97.9 F (36.6 C)-98.5 F (36.9 C)] 98 F (36.7 C) (01/08 0849) Pulse Rate:  [68-87] 75 (01/08 0849) Resp:  [12-24] 24 (01/08 0849) BP: (108-127)/(72-84) 120/84 (01/08 0849) SpO2:  [88 %-96 %] 90 % (01/08 0849) Last BM Date : 08/01/23  Intake/Output from previous day: 01/07 0701 - 01/08 0700 In: -  Out: 1845 [Urine:1845] Intake/Output this shift: Total I/O In: -  Out: 400 [Urine:400]  General appearance: alert, cooperative, appears stated age, and no distress Resp: clear to auscultation bilaterally Cardio: regular rate and rhythm, S1, S2 normal, no murmur, click, rub or gallop GI: soft, non-tender; bowel sounds normal; no masses,  no organomegaly  Lab Results: Recent Labs    08/01/23 1429 08/01/23 2005 08/02/23 0424  WBC 5.5 4.9 4.3  HGB 10.8* 10.8* 9.6*  HCT 31.5* 32.1* 27.7*  PLT 172 187 158   BMET Recent Labs    07/31/23 0326 08/01/23 0435 08/02/23 0424  NA 136 139 137  K 5.0 3.5 3.6  CL 105 103 104  CO2 22 27 26   GLUCOSE 153* 107* 99  BUN 13 11 10   CREATININE 1.16 1.06 1.12  CALCIUM 8.4* 8.6* 8.3*   LFT Recent Labs    07/30/23 1910  PROT 7.3  ALBUMIN 4.4  AST 21  ALT 23  ALKPHOS 50  BILITOT 0.5   Studies/Results: No results found.  Medications: I have reviewed the patient's current medications. Prior to Admission:  Medications Prior to Admission  Medication Sig Dispense Refill Last Dose/Taking   allopurinol  (ZYLOPRIM ) 300 MG tablet Take 300 mg by mouth daily.   Past  Week   gabapentin  (NEURONTIN ) 300 MG capsule Take 300 mg by mouth daily as needed (for nerve pain).   07/26/2023   omeprazole (PRILOSEC) 20 MG capsule Take 20 mg by mouth daily as needed (for heartburn).   Past Week   oxybutynin  (DITROPAN -XL) 10 MG 24 hr tablet TAKE ONE TABLET BY MOUTH DAILY FOR OVERACTIVE BLADDER   Past Week   tamsulosin  (FLOMAX ) 0.4 MG CAPS capsule Take 0.4 mg by mouth daily.   Past Week   Scheduled:  allopurinol   300 mg Oral Daily   oxybutynin   10 mg Oral QHS   pantoprazole   40 mg Oral Daily   polyethylene glycol-electrolytes  4,000 mL Oral Once   tamsulosin   0.4 mg Oral Daily   Continuous: PRN:acetaminophen  **OR** acetaminophen , gabapentin , ondansetron  **OR** ondansetron  (ZOFRAN ) IV  Assessment/Plan: 1) Acute anemia secondary to rectal bleeding from presumed diverticular disease with a negative CTA-continue to monitor his blood counts closely-colonoscopy planned for tomorrow. 2) Chronic constipation-a high-fiber diet with liberal fluid intake and the use of stool softeners could have been discussed with him post discharge. 3) GERD-on Omeprazole at home. 4) Fatty infiltration of the liver. 5) Gout-on Allopurinol  at home. 6) Obesity  7) Back pain.  LOS: 2 days   Renaye Sous 08/02/2023, 12:40 PM

## 2023-08-03 ENCOUNTER — Encounter (HOSPITAL_COMMUNITY)
Admission: EM | Disposition: A | Discharge status: Home / Self Care | Payer: Self-pay | Source: Home / Self Care | Attending: Internal Medicine

## 2023-08-03 ENCOUNTER — Inpatient Hospital Stay (HOSPITAL_COMMUNITY): Payer: Managed Care, Other (non HMO) | Admitting: Certified Registered Nurse Anesthetist

## 2023-08-03 ENCOUNTER — Encounter (HOSPITAL_COMMUNITY): Payer: Self-pay | Admitting: Family Medicine

## 2023-08-03 DIAGNOSIS — K625 Hemorrhage of anus and rectum: Secondary | ICD-10-CM | POA: Diagnosis not present

## 2023-08-03 DIAGNOSIS — K921 Melena: Secondary | ICD-10-CM | POA: Diagnosis not present

## 2023-08-03 HISTORY — PX: COLONOSCOPY WITH PROPOFOL: SHX5780

## 2023-08-03 LAB — BASIC METABOLIC PANEL
Anion gap: 6 (ref 5–15)
BUN: 7 mg/dL (ref 6–20)
CO2: 27 mmol/L (ref 22–32)
Calcium: 8.5 mg/dL — ABNORMAL LOW (ref 8.9–10.3)
Chloride: 104 mmol/L (ref 98–111)
Creatinine, Ser: 1.13 mg/dL (ref 0.61–1.24)
GFR, Estimated: >60 mL/min (ref >60)
Glucose, Bld: 91 mg/dL (ref 70–99)
Potassium: 3.5 mmol/L (ref 3.5–5.1)
Sodium: 137 mmol/L (ref 135–145)

## 2023-08-03 LAB — CBC WITH DIFFERENTIAL/PLATELET
Abs Immature Granulocytes: 0.02 10*3/uL (ref 0.00–0.07)
Basophils Absolute: 0 10*3/uL (ref 0.0–0.1)
Basophils Relative: 0 %
Eosinophils Absolute: 0.1 10*3/uL (ref 0.0–0.5)
Eosinophils Relative: 2 %
HCT: 27.2 % — ABNORMAL LOW (ref 39.0–52.0)
Hemoglobin: 9.5 g/dL — ABNORMAL LOW (ref 13.0–17.0)
Immature Granulocytes: 1 %
Lymphocytes Relative: 34 %
Lymphs Abs: 1.5 10*3/uL (ref 0.7–4.0)
MCH: 31.5 pg (ref 26.0–34.0)
MCHC: 34.9 g/dL (ref 30.0–36.0)
MCV: 90.1 fL (ref 80.0–100.0)
Monocytes Absolute: 0.3 10*3/uL (ref 0.1–1.0)
Monocytes Relative: 7 %
Neutro Abs: 2.4 10*3/uL (ref 1.7–7.7)
Neutrophils Relative %: 56 %
Platelets: 187 10*3/uL (ref 150–400)
RBC: 3.02 MIL/uL — ABNORMAL LOW (ref 4.22–5.81)
RDW: 13.3 % (ref 11.5–15.5)
WBC: 4.2 10*3/uL (ref 4.0–10.5)
nRBC: 0 % (ref 0.0–0.2)

## 2023-08-03 LAB — FOLATE: Folate: 15.6 ng/mL (ref >5.9)

## 2023-08-03 LAB — VITAMIN B12: Vitamin B-12: 303 pg/mL (ref 180–914)

## 2023-08-03 LAB — MAGNESIUM: Magnesium: 2 mg/dL (ref 1.7–2.4)

## 2023-08-03 SURGERY — COLONOSCOPY WITH PROPOFOL
Anesthesia: Monitor Anesthesia Care

## 2023-08-03 MED ORDER — SODIUM CHLORIDE 0.9 % IV SOLN
INTRAVENOUS | Status: AC | PRN
  Administered 2023-08-03: 100 mL via INTRAVENOUS

## 2023-08-03 MED ORDER — PROPOFOL 500 MG/50ML IV EMUL
INTRAVENOUS | Status: DC | PRN
  Administered 2023-08-03: 135 ug/kg/min via INTRAVENOUS

## 2023-08-03 SURGICAL SUPPLY — 21 items
ELECT REM PT RETURN 9FT ADLT (ELECTROSURGICAL)
ELECTRODE REM PT RTRN 9FT ADLT (ELECTROSURGICAL) IMPLANT
FCP BXJMBJMB 240X2.8X (CUTTING FORCEPS)
FLOOR PAD 36X40 (MISCELLANEOUS) ×1
FORCEPS BIOP RAD 4 LRG CAP 4 (CUTTING FORCEPS) IMPLANT
FORCEPS BIOP RJ4 240 W/NDL (CUTTING FORCEPS)
FORCEPS BXJMBJMB 240X2.8X (CUTTING FORCEPS) IMPLANT
INJECTOR/SNARE I SNARE (MISCELLANEOUS) IMPLANT
LUBRICANT JELLY 4.5OZ STERILE (MISCELLANEOUS) IMPLANT
MANIFOLD NEPTUNE II (INSTRUMENTS) IMPLANT
NDL SCLEROTHERAPY 25GX240 (NEEDLE) IMPLANT
NEEDLE SCLEROTHERAPY 25GX240 (NEEDLE) IMPLANT
PAD FLOOR 36X40 (MISCELLANEOUS) ×1 IMPLANT
PROBE APC STR FIRE (PROBE) IMPLANT
PROBE INJECTION GOLD 7FR (MISCELLANEOUS) IMPLANT
SNARE ROTATE MED OVAL 20MM (MISCELLANEOUS) IMPLANT
SYR 50ML LL SCALE MARK (SYRINGE) IMPLANT
TRAP SPECIMEN MUCOUS 40CC (MISCELLANEOUS) IMPLANT
TUBING ENDO SMARTCAP PENTAX (MISCELLANEOUS) IMPLANT
TUBING IRRIGATION ENDOGATOR (MISCELLANEOUS) ×1 IMPLANT
WATER STERILE IRR 1000ML POUR (IV SOLUTION) IMPLANT

## 2023-08-03 NOTE — Progress Notes (Signed)
 PROGRESS NOTE                                                                                                                                                                                                             Patient Demographics:    Christopher Massey, is a 58 y.o. male, DOB - [birth year], FMW:[id number]  Outpatient Primary MD for the patient is [name]., MD    LOS - 3  Admit date - 07/30/2023    Chief Complaint  Patient presents with   Rectal Bleeding   Abdominal Pain       Brief Narrative (HPI from H&P)   58 y.o. male with medical history significant of GERD, gout, cervical spinal disk disease.   Pt with low back pain ever since he bent over to pick something up last week.   No pain shooting down legs, no leg weakness, numbness, saddle anesthesia, nor increased difficulty urinating from baseline (pt also has BPH though).   Pt taking mostly tylenol  and prescribed Neurontin  for back pain, not really taking OTC NSAIDS.   Today pt had sudden onset of GIB: started out as BRBPR before becoming dark / maroon in color.  Blood loss was pretty severe at first, now slowed down.   Subjective:    [name] full BMs overnight secondary to bowel prep, initially some red blood in it, but the last BMs were clear in color.       Assessment  & Plan :    Acute blood loss anemia due to rectal bleeding, due to diverticular bleed. - significant drop in H&H, baseline hemoglobin around 14, down to 9.5, continue to monitor closely and transfuse as needed.   - CTA GI bleed protocol negative. -Kept having BM with bright red blood during hospital stay, GI input greatly appreciated, went for colonoscopy this morning, . no significant finding in colon.   -Continue to monitor for another 24 hours, will repeat CBC in a.m., and if stable and no recurrent events of blood in stools likely can DC tomorrow. - Type screen has been  done.  Low-grade lumbago, likely due to muscle spasm No red flag symptoms. - Follow up with PCP, currently no pain.  BPH.  Home medications continued.         Condition - Extremely Guarded  Family Communication  :  None at bedside  Code Status : Full code  Consults  : GI  PUD Prophylaxis : PPI   Procedures  :     CTA - No contrast extravasation to localize GI bleed. NON-VASCULAR Few scattered colonic diverticula.  No active diverticulitis. Left lower pole nephrolithiasis.  No hydronephrosis. Hepatic steatosis.      Disposition Plan  :    Status is: Inpatient   DVT Prophylaxis  :    Place and maintain sequential compression device Start: 08/01/23 2233 SCDs Start: 07/31/23 0305    Lab Results  Component Value Date   PLT 187 08/03/2023    Diet :  Diet Order             Diet regular Room service appropriate? Yes; Fluid consistency: Thin  Diet effective now                    Inpatient Medications  Scheduled Meds:  allopurinol   300 mg Oral Daily   oxybutynin   10 mg Oral QHS   pantoprazole   40 mg Oral Daily   tamsulosin   0.4 mg Oral Daily   Continuous Infusions: PRN Meds:.acetaminophen  **OR** acetaminophen , gabapentin , ondansetron  **OR** ondansetron  (ZOFRAN ) IV  Antibiotics  :    Anti-infectives (From admission, onward)    None         Objective:   Vitals:   08/03/23 1232 08/03/23 1233 08/03/23 1240 08/03/23 1244  BP: 97/70 103/72 111/73   Pulse: 91 96 86 87  Resp: 13 (!) 22 14 14   Temp:      TempSrc:      SpO2: 96% 96% 96% 96%  Weight:      Height:        Wt Readings from Last 3 Encounters:  07/31/23 109.8 kg  03/02/21 109.8 kg  07/30/17 110.2 kg     Intake/Output Summary (Last 24 hours) at 08/03/2023 1316 Last data filed at 08/03/2023 1215 Gross per 24 hour  Intake 200 ml  Output 500 ml  Net -300 ml     Physical Exam   Patient was seen and examined earlier this morning before his colonoscopy  Awake Alert, Oriented  X 3, No new F.N deficits, Normal affect Symmetrical Chest wall movement, Good air movement bilaterally, CTAB RRR,No Gallops,Rubs or new Murmurs, No Parasternal Heave +ve B.Sounds, Abd Soft, No tenderness, No rebound - guarding or rigidity. No Cyanosis, Clubbing or edema, No new Rash or bruise         Data Review:    Recent Labs  Lab 07/30/23 1910 07/30/23 2153 08/01/23 1429 08/01/23 2005 08/02/23 0424 08/02/23 1704 08/03/23 0537  WBC 5.8   < > 5.5 4.9 4.3 4.5 4.2  HGB 16.0   < > 10.8* 10.8* 9.6* 10.3* 9.5*  HCT 46.6   < > 31.5* 32.1* 27.7* 30.1* 27.2*  PLT 201   < > 172 187 158 186 187  MCV 89.1   < > 90.5 91.2 89.6 91.5 90.1  MCH 30.6   < > 31.0 30.7 31.1 31.3 31.5  MCHC 34.3   < > 34.3 33.6 34.7 34.2 34.9  RDW 13.4   < > 13.7 13.7 13.6 13.6 13.3  LYMPHSABS 2.2  --   --   --  1.6  --  1.5  MONOABS 0.4  --   --   --  0.3  --  0.3  EOSABS 0.1  --   --   --  0.1  --  0.1  BASOSABS  0.0  --   --   --  0.0  --  0.0   < > = values in this interval not displayed.    Recent Labs  Lab 07/30/23 1910 07/31/23 0326 08/01/23 0435 08/02/23 0424 08/03/23 0537  NA 139 136 139 137 137  K 3.6 5.0 3.5 3.6 3.5  CL 102 105 103 104 104  CO2 29 22 27 26 27   ANIONGAP 8 9 9 7 6   GLUCOSE 106* 153* 107* 99 91  BUN 14 13 11 10 7   CREATININE 1.11 1.16 1.06 1.12 1.13  AST 21  --   --   --   --   ALT 23  --   --   --   --   ALKPHOS 50  --   --   --   --   BILITOT 0.5  --   --   --   --   ALBUMIN 4.4  --   --   --   --   MG  --   --   --  1.9 2.0  CALCIUM 9.0 8.4* 8.6* 8.3* 8.5*      Recent Labs  Lab 07/30/23 1910 07/31/23 0326 08/01/23 0435 08/02/23 0424 08/03/23 0537  MG  --   --   --  1.9 2.0  CALCIUM 9.0 8.4* 8.6* 8.3* 8.5*    --------------------------------------------------------------------------------------------------------------- Lab Results  Component Value Date   CHOL 173 01/22/2010   HDL 43.40 01/22/2010   LDLCALC 106 (H) 01/22/2010   TRIG 119.0 01/22/2010    CHOLHDL 4 01/22/2010      Radiology Reports CT ANGIO GI BLEED Result Date: 07/30/2023 CLINICAL DATA:  Left lower quadrant pain, back pain. Right red blood in stool. EXAM: CTA ABDOMEN AND PELVIS WITHOUT AND WITH CONTRAST TECHNIQUE: Multidetector CT imaging of the abdomen and pelvis was performed using the standard protocol during bolus administration of intravenous contrast. Multiplanar reconstructed images and MIPs were obtained and reviewed to evaluate the vascular anatomy. RADIATION DOSE REDUCTION: This exam was performed according to the departmental dose-optimization program which includes automated exposure control, adjustment of the mA and/or kV according to patient size and/or use of iterative reconstruction technique. CONTRAST:  OMNIPAQUE  IOHEXOL  350 MG/ML SOLN COMPARISON:  03/02/2021 FINDINGS: VASCULAR Aorta: Normal caliber aorta without aneurysm, dissection, vasculitis or significant stenosis. Celiac: Patent without evidence of aneurysm, dissection, vasculitis or significant stenosis. SMA: Patent without evidence of aneurysm, dissection, vasculitis or significant stenosis. Renals: Both renal arteries are patent without evidence of aneurysm, dissection, vasculitis, fibromuscular dysplasia or significant stenosis. IMA: Patent without evidence of aneurysm, dissection, vasculitis or significant stenosis. Inflow: Patent without evidence of aneurysm, dissection, vasculitis or significant stenosis. Proximal Outflow: Bilateral common femoral and visualized portions of the superficial and profunda femoral arteries are patent without evidence of aneurysm, dissection, vasculitis or significant stenosis. Veins: No obvious venous abnormality within the limitations of this arterial phase study. Review of the MIP images confirms the above findings. NON-VASCULAR Lower chest: Linear atelectasis or scarring in the lower lobes. No effusions. Hepatobiliary: Diffuse low-density throughout the liver compatible  with fatty infiltration. No focal abnormality. Gallbladder unremarkable. Pancreas: No focal abnormality or ductal dilatation. Spleen: No focal abnormality.  Normal size. Adrenals/Urinary Tract: Punctate left lower pole nephrolithiasis. No ureteral stones or hydronephrosis. Adrenal glands and urinary bladder unremarkable. Stomach/Bowel: Normal appendix. Few scattered colonic diverticula. No active diverticulitis. No contrast extravasation to localize GI bleed. Stomach and small bowel decompressed. Lymphatic: No adenopathy Reproductive: Mildly enlarged prostate. Other:  No free fluid or free air. Musculoskeletal: No acute bony abnormality. IMPRESSION: VASCULAR No contrast extravasation to localize GI bleed. NON-VASCULAR Few scattered colonic diverticula.  No active diverticulitis. Left lower pole nephrolithiasis.  No hydronephrosis. Hepatic steatosis. Electronically Signed   By: Franky Crease M.D.   On: 07/30/2023 20:54      Signature  -   Brayton Lye M.D on 08/03/2023 at 1:16 PM   -  To page go to [email or web address]

## 2023-08-03 NOTE — Anesthesia Preprocedure Evaluation (Addendum)
 Anesthesia Evaluation  Patient identified by MRN, date of birth, ID band Patient awake    Reviewed: Allergy & Precautions, NPO status , Patient's Chart, lab work & pertinent test results  Airway Mallampati: II  TM Distance: >3 FB Neck ROM: Full    Dental  (+) Teeth Intact, Dental Advisory Given   Pulmonary neg pulmonary ROS   Pulmonary exam normal breath sounds clear to auscultation       Cardiovascular negative cardio ROS Normal cardiovascular exam Rhythm:Regular Rate:Normal     Neuro/Psych negative neurological ROS  negative psych ROS   GI/Hepatic Neg liver ROS,GERD  ,,  Endo/Other  negative endocrine ROS    Renal/GU negative Renal ROS  negative genitourinary   Musculoskeletal negative musculoskeletal ROS (+)    Abdominal   Peds  Hematology  (+) Blood dyscrasia, anemia Lab Results      Component                Value               Date                      WBC                      4.2                 08/03/2023                HGB                      9.5 (L)             08/03/2023                HCT                      27.2 (L)            08/03/2023                MCV                      90.1                08/03/2023                PLT                      187                 08/03/2023              Anesthesia Other Findings Acute anemia secondary to rectal bleeding from presumed diverticular disease  Reproductive/Obstetrics                             Anesthesia Physical Anesthesia Plan  ASA: 2  Anesthesia Plan: MAC   Post-op Pain Management:    Induction: Intravenous  PONV Risk Score and Plan: Propofol  infusion and Treatment may vary due to age or medical condition  Airway Management Planned: Natural Airway  Additional Equipment:   Intra-op Plan:   Post-operative Plan:   Informed Consent: I have reviewed the patients History and Physical, chart, labs and discussed  the procedure including the risks, benefits and alternatives for the proposed anesthesia with the patient or  authorized representative who has indicated his/her understanding and acceptance.     Dental advisory given  Plan Discussed with: CRNA  Anesthesia Plan Comments:        Anesthesia Quick Evaluation

## 2023-08-03 NOTE — Plan of Care (Signed)

## 2023-08-03 NOTE — Interval H&P Note (Signed)
 History and Physical Interval Note:  08/03/2023 11:34 AM  [name]  has presented today for surgery, with the diagnosis of Hematochezia.  The various methods of treatment have been discussed with the patient and family. After consideration of risks, benefits and other options for treatment, the patient has consented to  Procedure(s): COLONOSCOPY WITH PROPOFOL  (N/A) as a surgical intervention.  The patient's history has been reviewed, patient examined, no change in status, stable for surgery.  I have reviewed the patient's chart and labs.  Questions were answered to the patient's satisfaction.     Olamae Ferrara D

## 2023-08-03 NOTE — Transfer of Care (Signed)
 Immediate Anesthesia Transfer of Care Note  Patient: Christopher Massey  Procedure(s) Performed: COLONOSCOPY WITH PROPOFOL   Patient Location: PACU  Anesthesia Type:General  Level of Consciousness: awake and alert   Airway & Oxygen Therapy: Patient Spontanous Breathing and Patient connected to nasal cannula oxygen  Post-op Assessment: Report given to RN and Post -op Vital signs reviewed and stable  Post vital signs: Reviewed and stable  Last Vitals:  Vitals Value Taken Time  BP 96/60 08/03/23 1212  Temp    Pulse 98 08/03/23 1214  Resp 10 08/03/23 1214  SpO2 98 % 08/03/23 1214  Vitals shown include unfiled device data.  Last Pain:  Vitals:   08/03/23 1212  TempSrc:   PainSc: Asleep         Complications: No notable events documented.

## 2023-08-03 NOTE — Progress Notes (Signed)
Patient to endo at this time.

## 2023-08-03 NOTE — Op Note (Addendum)
 [facility] Patient Name: Christopher Massey Procedure Date : 08/03/2023 MRN: [id number] Attending MD: Belvie Just , MD, [id number] Date of Birth: Dec 09, 1965 CSN: [id number] Age: 58 Admit Type: Inpatient Procedure:                Colonoscopy Indications:              Hematochezia Providers:                Belvie Just, MD, Ozell Pouch, Lorrayne Kitty,                            Technician Referring MD:              Medicines:                Propofol  per Anesthesia Complications:            No immediate complications. Estimated Blood Loss:     Estimated blood loss: none. Procedure:                Pre-Anesthesia Assessment:                           - Prior to the procedure, a History and Physical                            was performed, and patient medications and                            allergies were reviewed. The patient's tolerance of                            previous anesthesia was also reviewed. The risks                            and benefits of the procedure and the sedation                            options and risks were discussed with the patient.                            All questions were answered, and informed consent                            was obtained. Prior Anticoagulants: The patient has                            taken no anticoagulant or antiplatelet agents. ASA                            Grade Assessment: II - A patient with mild systemic                            disease. After reviewing the risks and benefits,                            the patient  was deemed in satisfactory condition to                            undergo the procedure.                           - Sedation was administered by an anesthesia                            professional. Deep sedation was attained.                           After obtaining informed consent, the colonoscope                            was passed under direct vision. Throughout the                             procedure, the patient's blood pressure, pulse, and                            oxygen saturations were monitored continuously. The                            CF-HQ190L ([id number]) Olympus coloscope was                            introduced through the anus and advanced to the the                            cecum, identified by appendiceal orifice and                            ileocecal valve. The colonoscopy was performed                            without difficulty. The patient tolerated the                            procedure well. The quality of the bowel                            preparation was evaluated using the BBPS [facility]                            Bowel Preparation Scale) with scores of: Right                            Colon = 3, Transverse Colon = 3 and Left Colon = 3                            (entire mucosa seen well with no residual staining,  small fragments of stool or opaque liquid). The                            total BBPS score equals 9. The ileocecal valve,                            appendiceal orifice, and rectum were photographed. Scope In: 11:43:14 AM Scope Out: 12:08:46 PM Scope Withdrawal Time: 0 hours 18 minutes 58 seconds  Total Procedure Duration: 0 hours 25 minutes 32 seconds  Findings:      The entire examined colon appeared normal.      During this examination there was no evidence of any diverticula,       however, there was clear evidence of some clots in the left side of the       colon. After complete withdrawal of the colonoscope, a repeat pass was       made up to the mid transverse colon, but no abnormalities were       identified. A few scattered diverticula were noted with the recent CTA       and there imaging confirmation of diverticula with the 03/2021       colonoscopy. The current findings are consistent with a diverticular       bleed, even though no diverticula were visualized with the  procedure. Impression:               - The entire examined colon is normal.                           - No specimens collected. Recommendation:           - Return patient to hospital ward for ongoing care.                           - Resume regular diet.                           - Continue present medications.                           - Follow HGB and transfuse if necessary.                           - If no further bleeding in 24 hours he can be                            discharged home. Procedure Code(s):        --- Professional ---                           [phone], Colonoscopy, flexible; diagnostic, including                            collection of specimen(s) by brushing or washing,                            when performed (separate procedure) Diagnosis Code(s):        --- Professional ---  K92.1, Melena (includes Hematochezia) CPT copyright 2022 [employer]. All rights reserved. The codes documented in this report are preliminary and upon coder review may  be revised to meet current compliance requirements. Belvie Just, MD Belvie Just, MD 08/03/2023 12:26:47 PM This report has been signed electronically. Number of Addenda: 0

## 2023-08-03 NOTE — Plan of Care (Signed)

## 2023-08-03 NOTE — Progress Notes (Signed)
Patient returns from endo at this time 

## 2023-08-04 DIAGNOSIS — K625 Hemorrhage of anus and rectum: Secondary | ICD-10-CM | POA: Diagnosis not present

## 2023-08-04 LAB — CBC WITH DIFFERENTIAL/PLATELET
Abs Immature Granulocytes: 0 10*3/uL (ref 0.00–0.07)
Basophils Absolute: 0 10*3/uL (ref 0.0–0.1)
Basophils Relative: 1 %
Eosinophils Absolute: 0.1 10*3/uL (ref 0.0–0.5)
Eosinophils Relative: 2 %
HCT: 27.2 % — ABNORMAL LOW (ref 39.0–52.0)
Hemoglobin: 9.2 g/dL — ABNORMAL LOW (ref 13.0–17.0)
Immature Granulocytes: 0 %
Lymphocytes Relative: 35 %
Lymphs Abs: 1.5 10*3/uL (ref 0.7–4.0)
MCH: 31 pg (ref 26.0–34.0)
MCHC: 33.8 g/dL (ref 30.0–36.0)
MCV: 91.6 fL (ref 80.0–100.0)
Monocytes Absolute: 0.3 10*3/uL (ref 0.1–1.0)
Monocytes Relative: 7 %
Neutro Abs: 2.3 10*3/uL (ref 1.7–7.7)
Neutrophils Relative %: 55 %
Platelets: 207 10*3/uL (ref 150–400)
RBC: 2.97 MIL/uL — ABNORMAL LOW (ref 4.22–5.81)
RDW: 13.6 % (ref 11.5–15.5)
WBC: 4.2 10*3/uL (ref 4.0–10.5)
nRBC: 0 % (ref 0.0–0.2)

## 2023-08-04 LAB — BASIC METABOLIC PANEL
Anion gap: 8 (ref 5–15)
BUN: 11 mg/dL (ref 6–20)
CO2: 27 mmol/L (ref 22–32)
Calcium: 8.8 mg/dL — ABNORMAL LOW (ref 8.9–10.3)
Chloride: 102 mmol/L (ref 98–111)
Creatinine, Ser: 1.22 mg/dL (ref 0.61–1.24)
GFR, Estimated: >60 mL/min (ref >60)
Glucose, Bld: 95 mg/dL (ref 70–99)
Potassium: 3.3 mmol/L — ABNORMAL LOW (ref 3.5–5.1)
Sodium: 137 mmol/L (ref 135–145)

## 2023-08-04 LAB — MAGNESIUM: Magnesium: 2 mg/dL (ref 1.7–2.4)

## 2023-08-04 MED ORDER — POTASSIUM CHLORIDE CRYS ER 20 MEQ PO TBCR
40.0000 meq | EXTENDED_RELEASE_TABLET | Freq: Once | ORAL | Status: AC
  Administered 2023-08-04: 40 meq via ORAL
  Filled 2023-08-04: qty 2

## 2023-08-04 MED ORDER — CYANOCOBALAMIN 1000 MCG/ML IJ SOLN
1000.0000 ug | Freq: Every day | INTRAMUSCULAR | Status: DC
  Administered 2023-08-04: 1000 ug via SUBCUTANEOUS
  Filled 2023-08-04: qty 1

## 2023-08-04 MED ORDER — CYANOCOBALAMIN 500 MCG PO TABS: 500.0000 ug | ORAL_TABLET | Freq: Every day | ORAL | Status: AC

## 2023-08-04 NOTE — Discharge Summary (Signed)
 Physician Discharge Summary  Christopher Massey FMW:[id number] DOB: 25-Dec-1965 DOA: 07/30/2023  PCP: [name]., MD  Admit date: 07/30/2023 Discharge date: 08/04/2023  Admitted From: (Home) Disposition:  (Home )  Recommendations for Outpatient Follow-up:  Follow up with PCP in 1-2 weeks Please obtain BMP/CBC in one week Please follow up on B12 level in 6 to 8 weeks.   Diet recommendation: Regular diet  Brief/Interim Summary:   58 y.o. male with medical history significant of GERD, gout, cervical spinal disk disease.   Pt with low back pain ever since he bent over to pick something up last week.   No pain shooting down legs, no leg weakness, numbness, saddle anesthesia, nor increased difficulty urinating from baseline (pt also has BPH though).   Pt taking mostly tylenol  and prescribed Neurontin  for back pain, not really taking OTC NSAIDS.    pt had sudden onset of GIB: started out as BRBPR before becoming dark / maroon in color.  Blood loss was pretty severe at first, which prompted him to come to ED, patient was admitted for further workup for bright red blood per rectum, was seen by GI please see discussion below   Acute blood loss anemia due to rectal bleeding, due to diverticular bleed. - significant drop in H&H, baseline hemoglobin around 14, has been gradually trending down during hospital stay, but has stabilized over last few days, it is 9.2 on discharge, he did not require any PRBC transfusion.   - CTA GI bleed protocol negative.  Significant for candidate for few colonic diverticulse -Kept having BM with bright red blood during hospital stay, GI input greatly appreciated, went for colonoscopy/03/2024, with no significant finding in the colon, no evidence of active GI bleed, so he was monitored overnight post procedure, no BM overnight, no black or blood per rectum, hemoglobin this stable at this morning at 9.2, so he stable for discharge as discussed with GI. -I have with the  patient, this is most likely diverticular bleed, and it might recur, if it happens he needs to come back to ED so hopefully we can see active explicable physician on the CTA GI bleeding protocol so intervention can be performed if needed no significant finding in colon.   -B12 is borderline, so was started on supplements   Low-grade lumbago, likely due to muscle spasm No red flag symptoms. - Follow up with PCP, currently no pain.   BPH.  Home medications continued.    Discharge Diagnoses:  Principal Problem:   Rectal bleeding Active Problems:   Lumbago    Discharge Instructions  Discharge Instructions     Diet - low sodium heart healthy   Complete by: As directed    Discharge instructions   Complete by: As directed    Follow with Primary MD [name]., MD in 7 days   Get CBC, CMP, 2 view Chest X ray checked  by Primary MD next visit.    Activity: As tolerated with Full fall precautions use walker/cane & assistance as needed   Disposition Home    Diet: Regular Diet   On your next visit with your primary care physician please Get Medicines reviewed and adjusted.   Please request your Prim.MD to go over all Hospital Tests and Procedure/Radiological results at the follow up, please get all Hospital records sent to your Prim MD by signing hospital release before you go home.   If you experience worsening of your admission symptoms, develop shortness of breath, life threatening emergency,  suicidal or homicidal thoughts you must seek medical attention immediately by calling 911 or calling your MD immediately  if symptoms less severe.  You Must read complete instructions/literature along with all the possible adverse reactions/side effects for all the Medicines you take and that have been prescribed to you. Take any new Medicines after you have completely understood and accpet all the possible adverse reactions/side effects.   Do not drive, operating heavy machinery,  perform activities at heights, swimming or participation in water activities or provide baby sitting services if your were admitted for syncope or siezures until you have seen by Primary MD or a Neurologist and advised to do so again.  Do not drive when taking Pain medications.    Do not take more than prescribed Pain, Sleep and Anxiety Medications  Special Instructions: If you have smoked or chewed Tobacco  in the last 2 yrs please stop smoking, stop any regular Alcohol  and or any Recreational drug use.  Wear Seat belts while driving.   Please note  You were cared for by a hospitalist during your hospital stay. If you have any questions about your discharge medications or the care you received while you were in the hospital after you are discharged, you can call the unit and asked to speak with the hospitalist on call if the hospitalist that took care of you is not available. Once you are discharged, your primary care physician will handle any further medical issues. Please note that NO REFILLS for any discharge medications will be authorized once you are discharged, as it is imperative that you return to your primary care physician (or establish a relationship with a primary care physician if you do not have one) for your aftercare needs so that they can reassess your need for medications and monitor your lab values.   Increase activity slowly   Complete by: As directed       Allergies as of 08/04/2023   No Known Allergies      Medication List     TAKE these medications    allopurinol  300 MG tablet Commonly known as: ZYLOPRIM  Take 300 mg by mouth daily.   cyanocobalamin  500 MCG tablet Commonly known as: VITAMIN B12 Take 1 tablet (500 mcg total) by mouth daily.   gabapentin  300 MG capsule Commonly known as: NEURONTIN  Take 300 mg by mouth daily as needed (for nerve pain).   omeprazole 20 MG capsule Commonly known as: PRILOSEC Take 20 mg by mouth daily as needed (for  heartburn).   oxybutynin  10 MG 24 hr tablet Commonly known as: DITROPAN -XL TAKE ONE TABLET BY MOUTH DAILY FOR OVERACTIVE BLADDER   tamsulosin  0.4 MG Caps capsule Commonly known as: FLOMAX  Take 0.4 mg by mouth daily.        Follow-up Information     Rollin Dover, MD Follow up.   Specialty: Gastroenterology Contact information: [address] [place] [address] [phone]                No Known Allergies  Consultations: Gastroenterology   Procedures/Studies: CT ANGIO GI BLEED Result Date: 07/30/2023 CLINICAL DATA:  Left lower quadrant pain, back pain. Right red blood in stool. EXAM: CTA ABDOMEN AND PELVIS WITHOUT AND WITH CONTRAST TECHNIQUE: Multidetector CT imaging of the abdomen and pelvis was performed using the standard protocol during bolus administration of intravenous contrast. Multiplanar reconstructed images and MIPs were obtained and reviewed to evaluate the vascular anatomy. RADIATION DOSE REDUCTION: This exam was performed according to  the departmental dose-optimization program which includes automated exposure control, adjustment of the mA and/or kV according to patient size and/or use of iterative reconstruction technique. CONTRAST:  OMNIPAQUE  IOHEXOL  350 MG/ML SOLN COMPARISON:  03/02/2021 FINDINGS: VASCULAR Aorta: Normal caliber aorta without aneurysm, dissection, vasculitis or significant stenosis. Celiac: Patent without evidence of aneurysm, dissection, vasculitis or significant stenosis. SMA: Patent without evidence of aneurysm, dissection, vasculitis or significant stenosis. Renals: Both renal arteries are patent without evidence of aneurysm, dissection, vasculitis, fibromuscular dysplasia or significant stenosis. IMA: Patent without evidence of aneurysm, dissection, vasculitis or significant stenosis. Inflow: Patent without evidence of aneurysm, dissection, vasculitis or significant stenosis. Proximal Outflow: Bilateral common femoral  and visualized portions of the superficial and profunda femoral arteries are patent without evidence of aneurysm, dissection, vasculitis or significant stenosis. Veins: No obvious venous abnormality within the limitations of this arterial phase study. Review of the MIP images confirms the above findings. NON-VASCULAR Lower chest: Linear atelectasis or scarring in the lower lobes. No effusions. Hepatobiliary: Diffuse low-density throughout the liver compatible with fatty infiltration. No focal abnormality. Gallbladder unremarkable. Pancreas: No focal abnormality or ductal dilatation. Spleen: No focal abnormality.  Normal size. Adrenals/Urinary Tract: Punctate left lower pole nephrolithiasis. No ureteral stones or hydronephrosis. Adrenal glands and urinary bladder unremarkable. Stomach/Bowel: Normal appendix. Few scattered colonic diverticula. No active diverticulitis. No contrast extravasation to localize GI bleed. Stomach and small bowel decompressed. Lymphatic: No adenopathy Reproductive: Mildly enlarged prostate. Other: No free fluid or free air. Musculoskeletal: No acute bony abnormality. IMPRESSION: VASCULAR No contrast extravasation to localize GI bleed. NON-VASCULAR Few scattered colonic diverticula.  No active diverticulitis. Left lower pole nephrolithiasis.  No hydronephrosis. Hepatic steatosis. Electronically Signed   By: Franky Crease M.D.   On: 07/30/2023 20:54      Subjective:  Complaints overnight or this morning, no BM, no bright red blood per rectum since his colonoscopy, patient ambulated in the hallway no dyspnea or dizziness. Discharge Exam: Vitals:   08/04/23 0000 08/04/23 0336  BP: 104/70 102/67  Pulse:  67  Resp: 14   Temp:  98 F (36.7 C)  SpO2: 96%    Vitals:   08/03/23 2002 08/03/23 2354 08/04/23 0000 08/04/23 0336  BP:  109/79 104/70 102/67  Pulse: 87 75  67  Resp:  12 14   Temp: 98.5 F (36.9 C) 98 F (36.7 C)  98 F (36.7 C)  TempSrc: Oral Oral  Axillary  SpO2:  98% 98% 96%   Weight:      Height:        General: Pt is alert, awake, not in acute distress Cardiovascular: RRR, S1/S2 +, no rubs, no gallops Respiratory: CTA bilaterally, no wheezing, no rhonchi Abdominal: Soft, NT, ND, bowel sounds + Extremities: no edema, no cyanosis    The results of significant diagnostics from this hospitalization (including imaging, microbiology, ancillary and laboratory) are listed below for reference.     Microbiology: No results found for this or any previous visit (from the past 240 hours).   Labs: BNP (last 3 results) No results for input(s): BNP in the last 8760 hours. Basic Metabolic Panel: Recent Labs  Lab 07/31/23 0326 08/01/23 0435 08/02/23 0424 08/03/23 0537 08/04/23 0528  NA 136 139 137 137 137  K 5.0 3.5 3.6 3.5 3.3*  CL 105 103 104 104 102  CO2 22 27 26 27 27   GLUCOSE 153* 107* 99 91 95  BUN 13 11 10 7 11   CREATININE 1.16 1.06 1.12 1.13 1.22  CALCIUM 8.4* 8.6* 8.3* 8.5* 8.8*  MG  --   --  1.9 2.0 2.0   Liver Function Tests: Recent Labs  Lab 07/30/23 1910  AST 21  ALT 23  ALKPHOS 50  BILITOT 0.5  PROT 7.3  ALBUMIN 4.4   Recent Labs  Lab 07/30/23 1910  LIPASE 66*   No results for input(s): AMMONIA in the last 168 hours. CBC: Recent Labs  Lab 07/30/23 1910 07/30/23 2153 08/01/23 2005 08/02/23 0424 08/02/23 1704 08/03/23 0537 08/04/23 0528  WBC 5.8   < > 4.9 4.3 4.5 4.2 4.2  NEUTROABS 3.1  --   --  2.3  --  2.4 2.3  HGB 16.0   < > 10.8* 9.6* 10.3* 9.5* 9.2*  HCT 46.6   < > 32.1* 27.7* 30.1* 27.2* 27.2*  MCV 89.1   < > 91.2 89.6 91.5 90.1 91.6  PLT 201   < > 187 158 186 187 207   < > = values in this interval not displayed.   Cardiac Enzymes: No results for input(s): CKTOTAL, CKMB, CKMBINDEX, TROPONINI in the last 168 hours. BNP: Invalid input(s): POCBNP CBG: No results for input(s): GLUCAP in the last 168 hours. D-Dimer No results for input(s): DDIMER in the last 72 hours. Hgb  A1c No results for input(s): HGBA1C in the last 72 hours. Lipid Profile No results for input(s): CHOL, HDL, LDLCALC, TRIG, CHOLHDL, LDLDIRECT in the last 72 hours. Thyroid function studies No results for input(s): TSH, T4TOTAL, T3FREE, THYROIDAB in the last 72 hours.  Invalid input(s): FREET3 Anemia work up Recent Labs    08/03/23 1634  VITAMINB12 303  FOLATE 15.6   Urinalysis    Component Value Date/Time   COLORURINE YELLOW 03/02/2021 1659   APPEARANCEUR CLEAR 03/02/2021 1659   LABSPEC 1.025 03/02/2021 1659   PHURINE 6.0 03/02/2021 1659   GLUCOSEU NEGATIVE 03/02/2021 1659   HGBUR TRACE (A) 03/02/2021 1659   BILIRUBINUR NEGATIVE 03/02/2021 1659   KETONESUR NEGATIVE 03/02/2021 1659   PROTEINUR NEGATIVE 03/02/2021 1659   NITRITE NEGATIVE 03/02/2021 1659   LEUKOCYTESUR NEGATIVE 03/02/2021 1659   Sepsis Labs Recent Labs  Lab 08/02/23 0424 08/02/23 1704 08/03/23 0537 08/04/23 0528  WBC 4.3 4.5 4.2 4.2   Microbiology No results found for this or any previous visit (from the past 240 hours).   Time coordinating discharge: Over 30 minutes  SIGNED:   Brayton Lye, MD  [facility] 08/04/2023, 10:36 AM Pager   If 7PM-7AM, please contact night-coverage [email or web address] Password [email or web address]

## 2023-08-04 NOTE — Plan of Care (Signed)
  Problem: Health Behavior/Discharge Planning: Goal: Ability to manage health-related needs will improve Outcome: Progressing   Problem: Clinical Measurements: Goal: Ability to maintain clinical measurements within normal limits will improve Outcome: Progressing Goal: Will remain free from infection Outcome: Progressing Goal: Cardiovascular complication will be avoided Outcome: Progressing   Problem: Activity: Goal: Risk for activity intolerance will decrease Outcome: Progressing   

## 2023-08-04 NOTE — Anesthesia Postprocedure Evaluation (Signed)
 Anesthesia Post Note  Patient: Christopher Massey  Procedure(s) Performed: COLONOSCOPY WITH PROPOFOL      Patient location during evaluation: Endoscopy Anesthesia Type: MAC Level of consciousness: awake Pain management: pain level controlled Vital Signs Assessment: post-procedure vital signs reviewed and stable Respiratory status: spontaneous breathing and respiratory function stable Cardiovascular status: stable Postop Assessment: no apparent nausea or vomiting Anesthetic complications: no   No notable events documented.  Last Vitals:  Vitals:   08/04/23 0000 08/04/23 0336  BP: 104/70 102/67  Pulse:  67  Resp: 14   Temp:  36.7 C  SpO2: 96%     Last Pain:  Vitals:   08/04/23 0400  TempSrc:   PainSc: Asleep                 Denali Sharma P Michalla Ringer

## 2023-08-04 NOTE — Discharge Instructions (Signed)
 Follow with Primary MD [name]., MD in 7 days   Get CBC, CMP, 2 view Chest X ray checked  by Primary MD next visit.    Activity: As tolerated with Full fall precautions use walker/cane & assistance as needed   Disposition Home    Diet: Regular Diet   On your next visit with your primary care physician please Get Medicines reviewed and adjusted.   Please request your Prim.MD to go over all Hospital Tests and Procedure/Radiological results at the follow up, please get all Hospital records sent to your Prim MD by signing hospital release before you go home.   If you experience worsening of your admission symptoms, develop shortness of breath, life threatening emergency, suicidal or homicidal thoughts you must seek medical attention immediately by calling 911 or calling your MD immediately  if symptoms less severe.  You Must read complete instructions/literature along with all the possible adverse reactions/side effects for all the Medicines you take and that have been prescribed to you. Take any new Medicines after you have completely understood and accpet all the possible adverse reactions/side effects.   Do not drive, operating heavy machinery, perform activities at heights, swimming or participation in water activities or provide baby sitting services if your were admitted for syncope or siezures until you have seen by Primary MD or a Neurologist and advised to do so again.  Do not drive when taking Pain medications.    Do not take more than prescribed Pain, Sleep and Anxiety Medications  Special Instructions: If you have smoked or chewed Tobacco  in the last 2 yrs please stop smoking, stop any regular Alcohol  and or any Recreational drug use.  Wear Seat belts while driving.   Please note  You were cared for by a hospitalist during your hospital stay. If you have any questions about your discharge medications or the care you received while you were in the hospital after  you are discharged, you can call the unit and asked to speak with the hospitalist on call if the hospitalist that took care of you is not available. Once you are discharged, your primary care physician will handle any further medical issues. Please note that NO REFILLS for any discharge medications will be authorized once you are discharged, as it is imperative that you return to your primary care physician (or establish a relationship with a primary care physician if you do not have one) for your aftercare needs so that they can reassess your need for medications and monitor your lab values.

## 2023-08-04 NOTE — TOC Transition Note (Signed)
 Transition of Care [facility]) - Discharge Note   Patient Details  Name: Christopher Massey MRN: [id number] Date of Birth: [birth year]  Transition of [facility]) CM/SW Contact:  Hendricks KANDICE Her, RN Phone Number: 08/04/2023, 10:16 AM   Clinical Narrative:     Patient will DC to home today. No TOC needs identified. Patient will follow up as directed on AVS           Patient Goals and CMS Choice            Discharge Placement                       Discharge Plan and Services Additional resources added to the After Visit Summary for                                       Social Drivers of Health (SDOH) Interventions SDOH Screenings   Food Insecurity: No Food Insecurity (07/31/2023)  Housing: Low Risk  (07/31/2023)  Transportation Needs: No Transportation Needs (07/31/2023)  Utilities: Not At Risk (07/31/2023)  Social Connections: Unknown (12/05/2021)   Received from [facility]  Tobacco Use: Low Risk  (07/31/2023)     Readmission Risk Interventions     No data to display

## 2023-08-06 ENCOUNTER — Encounter (HOSPITAL_COMMUNITY): Payer: Self-pay | Admitting: Gastroenterology

## 2024-03-20 NOTE — Progress Notes (Signed)
PFT was completed.

## 2024-03-20 NOTE — Progress Notes (Signed)
 Primary care provider: Murray CHRISTELLA Amos, MD Referring provider:      Carlin Hamburg, PA  Assessment     1. Cough, unspecified type   2. Allergic rhinitis, unspecified seasonality, unspecified trigger    58 year old man, lifelong non-smoker with history of allergic rhinitis presents for evaluation regarding worsening productive cough over the last several months.  He reports that the cough began after a hospitalization for an unrelated reason.  He may have bronchitis with postnasal drainage and allergies that could be worsening his symptoms.  He does have well-controlled GERD.  He is followed by the [facility] for sleep apnea and is feeling tired. We are happy to evaluate his compliance on his next visit to give any suggestions. [facility]   Plan   [facility]  Allergy testing with recommendations Call with worsening symptoms Cpap compliance next visit.  Annual influenza vaccination if no contraindications and pneumonia vaccination per current guidelines.   We discussed the diagnosis and treatment plan in detail and the patient expressed understanding.    Thank you for allowing us  to participate in the care of this patient.  Total time spent in this encounter on date of service was 47 minutes and does not include additional procedure time.  Including but not limited to activities such as reviewing patient records, obtaining or reviewing subjective medical history, obtaining or performing a history and physical examination, counseling and/or educating patient/family/caregiver, ordering prescription medication, tests, procedures, imaging, labs, referring to and communicating with other health care providers, documenting appropriate clinical information in the medical record electronic or other, interpreting results of prescribed tests/procedures, communicating those results to the patient/family/representative and coordinating patient care.    Orders Placed This Encounter   Procedures   Allergy skin tests allergens, each   Spirometry with Lung Volumes (PL), DLCO       Subjective   Chief Complaint  Patient presents with   New Patient    Referred by Carlin Hamburg, PA for Chronic Cough   Cough    PFT Completed   Imaging    CT Chest 12/27/23 CXR 12/08/23   Sleep Apnea    [facility]    Patient ID:  Christopher Massey is a 58 y.o. male, lifelong nonsmoker with obesity presents with a cough that began about 6 months ago after a hospitilization for bleeding that resolved. He developed a dry cough and then a month ago he has continuous cough and sputum and it clear. He is coughing up a handful or so. He has not taken pred or antibiotics.  No wheezing. He is using albuterol  prn and does not use it. No fever or chills. No dyspnea with walking. He using flonase. He has well controlled gerd.  He has woken up for coughing.    PULMONARY The patient is not on home oxygen therapy. He denies a history of blood clots. He denies exposure to asbestos. He denies exposure to tuberculosis.  He denies history of a positive TB skin test.  He denies a history of COPD. He denies a history of asthma. He admits to a recent hospitalization. 07/2023 -  He denies a prior history of lung cancer.  There is no a prior history of lung surgery.  He denies a prior history pneumonia.  Occupational history: Acupuncturist for [employer] He has a prior history of sleep apnea.He is using cpap or bipap.The patient has excessive daytime sleepiness. He does not snore when using  cpap. The patient has disturbed sleep.  He denies morning headaches.   He denies a history of restless leg syndrome.  The patient has leg movements in his sleep.  He does not act out dreams. He does not grind his teeth.   He denies sleep walking.  He does not have nightmares.    He denies sleep paralysis.  He denies hallucinations.  The patient denies cataplexy.   He does not work  the second or third shift.   He goes to sleep at 9 - 11 PM and wakes up at 6 - 7 AM.  He sleeps for a total of about  6 - 7 hours. It take him greater than 30 minutes to fall asleep. He wakes up 2 time(s) during the night. He wakes up due to the following: Bathroom, TV.  The patient has nocturia.  The patient denies unexplained arousals from sleep.  He drinks 2 caffeinated beverages a day.    The sleep study was done at [facility] in [place].  The patient denies a family member with sleep apnea.   He has not gained weight recently. The patient's neck size is 16.5 inches.  Epworth Sleep Questionnaire  How likely are you to doze off or fall asleep in the following situations?  Sitting and reading: 0 = no chance of dozing Watching TV: 1 = slight chance of dozing Sitting inactive in a public place (such as a theater or a meeting):  0 = no chance of dozing As a passenger in a car for an hour without a break: 0 = no chance of dozing Lying down to rest in the afternoon when circumstances permit: 1 = slight chance of dozing Sitting and talking to someone: 0 = no chance of dozing Sitting quiety after a lunch without alcohol: 2 = moderate chance of dozing In a car, while stopped for a few minutes in traffic: 0 = no chance of dozing  Total Epworth: 4   Allergy Triggers Air Conditioning and Cold  Occupational Exposures None  Anxiety Screening GAD7 Review       03/20/2024  Generalized Anxiety Disorder 7 item (GAD-7)  1. Feeling nervous, anxious, or on the edge 0  2. Not being able to stop or control worrying 0  3. Worrying too much about different things 0  4. Trouble relaxing 0  5. Being so restless that it's hard to sit still 0  6. Being easily annoyed or irritable 0  7. Feeling afraid as if something awful might happen 0  Total Score 0  Interpretation: Normal     Review of Systems  Constitutional: Negative.   HENT: Negative.    Eyes: Negative.   Respiratory:   Positive for cough and sputum production.   Cardiovascular: Negative.   Gastrointestinal: Negative.   Genitourinary: Negative.   Musculoskeletal: Negative.   Skin: Negative.   Neurological: Negative.   Endo/Heme/Allergies:  Positive for environmental allergies.  Psychiatric/Behavioral: Negative.      History   Past Medical History:  Diagnosis Date   DDD (degenerative disc disease), lumbar    GERD (gastroesophageal reflux disease)    IBS (irritable bowel syndrome)    LEFT shoulder arthroscopy with lysis of adhesions, limited debridement, and subacromial decompression-11/11/2016 11/11/2016   Sleep apnea    CPAP   Sleep apnea 11/11/2016   official diag 2013   Past Surgical History:  Procedure Laterality Date   Shoulder arthroscopy Left 11/11/2016   LEFT shoulder arthroscopy with lysis of adhesions, limited  debridement, and subacromial decompression   Shoulder surgery Right    Social History[1] No family history on file.  Medication History      Medication Sig Dispense Refill   azithromycin  (ZITHROMAX ) 500 mg tablet Take one tablet (500 mg dose) by mouth daily for 5 days. 5 tablet 0   clidinium-chlordiazePOXIDE (LIBRAX) 2.5-5 mg per capsule Take 1 capsule by mouth 30 (thirty) minutes before meals & at bedtime.     ibuprofen  (ADVIL ,MOTRIN ) 800 mg tablet Take one tablet (800 mg total) by mouth every 8 (eight) hours as needed for Pain. 45 tablet 1   OMEPRAZOLE PO Take 40 mg by mouth.      traMADol (ULTRAM) 50 mg tablet 1-2 tid prn pain 45 tablet 0   No current facility-administered medications for this visit.        I reviewed the patient's medical,surgical,social and family history. The medications and allergies have been reviewed and updated.   Pulmonary Function Testing/Spirometry  No results found. Radiology   CXR:  Results for orders placed during the hospital encounter of 01/28/15  XR Chest Pa And Lateral  Narrative INDICATION:Shortness of  breath  TECHNIQUE: PA and lateral views of the chest  COMPARISON:None available  FINDINGS:  The lungs are clear. The cardiomediastinal and hilar silhouettes are within normal limits. No pleural effusion or pneumothorax. The visualized osseous structures are grossly unremarkable.  Impression IMPRESSION:No acute disease in the chest    CT Scan: No results found for this or any previous visit.   No results found for this or any previous visit.   No results found for this or any previous visit.   No results found for this or any previous visit.   No results found for this or any previous visit.   No results found for this or any previous visit.   No results found for this or any previous visit.   No results found for this or any previous visit.   VQ Scan: No results found for this or any previous visit.   TTE:   No results found for this or any previous visit.  Objective  BP 124/86 (BP Location: Right Lower Arm, Patient Position: Sitting)   Pulse 75   Resp 16   Ht 6' 2 (1.88 m)   Wt 249 lb 12.8 oz (113.3 kg)   SpO2 95%   BMI 32.07 kg/m   General appearance:  alert, appears stated age, and cooperative Head:    normocephalic Eyes:    pupils are equal, round and reactive Nose:     normal Mouth:    moist, no thrush, mallampati 4 Neck:    supple, no significant adenopathy, no thyromegaly, no JVD Chest:    clear to auscultation, no wheezes, rales or rhonchi, symmetric air entry Heart:    normal rate, regular rhythm, normal S1, S2, no murmurs, rubs, clicks or gallops Abdomen:   soft, nontender, nondistended Neurological:   alert, oriented Extremities:   peripheral pulses normal,no pitting edema , no clubbing or cyanosis  The patient was given a copy of their after visit summary.  Voice-recognition software was used in surveyor, minerals of this documentation. Unintended transcription errors may have escaped editorial review.    Immunizations     No  immunizations on file.         Patient's Medications       * Accurate as of March 20, 2024  9:36 AM. Reflects encounter med changes as of last refresh  New Prescriptions      Instructions  azithromycin  500 mg tablet Commonly known as: ZITHROMAX  Started by: Adnan Javaid  500 mg, Oral, Daily       Continued Medications      Instructions  clidinium-chlordiazePOXIDE 2.5-5 mg per capsule Commonly known as: LIBRAX  1 capsule, Oral, 30 minutes before meals & at bedtime   ibuprofen  800 mg tablet Commonly known as: ADVIL ,MOTRIN   800 mg, Oral, Every 8 hours as needed   OMEPRAZOLE PO  40 mg, Oral   traMADol 50 mg tablet Commonly known as: ULTRAM  1-2 tid prn pain                [1] Social History Socioeconomic History   Marital status: Married  Tobacco Use   Smoking status: Never   Smokeless tobacco: Never  Substance and Sexual Activity   Alcohol use: Yes    Comment: occ   Drug use: No  *Some images could not be shown.

## 2024-04-19 NOTE — Progress Notes (Signed)
 The patient was tested for common aeroallergens using the scratch allergy test and showed no significant sensitivities. After discussing the patient's symptoms with Dr. Javaid, it appears the patient is experiencing significant post nasal drip may have nonallergic rhinitis. The patient was instructed to use Flonase  and is advised to follow up in 4-6 weeks for re-evaluation of their symptoms.

## 2024-04-22 NOTE — Progress Notes (Signed)
 Primary care provider: Murray CHRISTELLA Amos, MD Referring provider:     Murray CHRISTELLA Amos, MD  Assessment    1. Chronic cough   2. Nonallergic rhinitis   3. OSA on CPAP    He has a chronic cough that is likely multifactorial with possible asthma and nonallergic rhinitis. He will trial Symbicort and Atrovent nasal spray with close follow up. He is not compliant with AutoCPAP therapy as he is having issues with aerophagia and I have reduced his pressure range today.  Plan   Chronic cough Nonallergic rhinitis ? Asthma component Skin allergy testing 04/19/2024 shows 0/70 positive environmental allergens Trial Atrovent nasal spray  Trial Symbicort 160, rinse mouth after each dose Continue Albuterol  as needed Normal PFT 03/20/2024 CT chest 12/2023 shows air trapping, indicating small airways disease Call if symptoms worsen Follow up in 6 weeks or sooner if needed  OSA on Autocpap with Aerophagia Reduce Autocpap pressure to 8-14 cm water pressure - increase adherence We discussed the importance of weight loss and daily exercise Adjust humidity for comfort Change CPAP/Bilevel supplies as recommended by DME company and insurance company  Obesity  BMI less than 18.5 is underweight  BMI 18.5 to 24.9 is normal or healthy weight  BMI 25.0 to 29.9 is overweight  BMI 30.0 or higher is obese Being obese means your BMI is 30 or higher. If your BMI is 40 or higher, you are considered severely or morbidly obese. Being obese may lead to many health problems. It may make it hard for you to breathe and move easily.   Obesity increases your risk for illnesses like diabetes, high blood pressure, high cholesterol, stroke, heart disease, and certain types of cancer. We recommend weight loss and daily exercise The American Heart Association recommends at least 150 minutes of moderate intensity aerobic activity per week to reduce the risk of cardiovascular complications.  Moderately intense aerobic activities  include brisk walking, tennis, gardening, biking, dancing  Consider a referral to the bariatric center for weight loss support   Total time spent in this encounter on date of service was 45 minutes and does not include additional procedure time. Including but not limited to activities such as reviewing patient records, obtaining or reviewing subjective medical history, obtaining or performing a history and physical examination, counseling and/or educating patient/family/caregiver, ordering prescription medication, tests, procedures, imaging, labs, referring to and communicating with other health care providers, documenting appropriate clinical information in the medical record electronic or other, interpreting results of prescribed tests/procedures, communicating those results to the patient/family/representative and coordinating patient care.    We recommend the COVID vaccine, yearly influenza vaccine and pneumonia vaccine if no contraindications per the current guidelines.   We discussed the diagnosis and treatment plan in detail and the patient expressed understanding.   Risks, benefits, and alternatives of the medications and treatment plan prescribed today were discussed, and patient expressed understanding. Plan follow-up as discussed or as needed if any worsening symptoms or change in condition.       Subjective   Chief Complaint  Patient presents with   Cough    Continuous. CPAP compliance is printing    Patient ID:  Christopher Massey is a 58 y.o. male lifelong nonsmoker with a history of cough and OSA on CPAP.  He presents for a follow up regarding cough. He has a chronic cough. He reports the azithromycin  did not help his cough. He has postnasal drip. Cough can be productive with clear sputum. Cough can  be worse at night and wakes him from sleep 2-3 nights a week. No wheezing, shortness of breath or chest tightness. He is taking Flonase and it does not help.   He is not  tolerating CPAP because the air pressure is too strong. He has aerophagia. He does not wake up refreshed and denies significant daytime sleepiness.  He uses a full face style mask and denies issues with air leaks and dry mouth.   He is 3% compliant with Autocpap at 8-16 cm. water pressure.   The average number of total hours used nightly is approximately 5. The apnea- hypopnea index (AHI) is reduced to approximately 5.8.   Test Last Date Completed  PFT 02/2024  --  Overnight Pulse Ox --  PSG/HST --  PAP Titration --  Skin allergy test/RAST 03/2024   Review of Systems  Constitutional:  Positive for malaise/fatigue. Negative for chills, fever and weight loss.  HENT:  Negative for congestion.   Respiratory:  Positive for cough. Negative for hemoptysis, sputum production, shortness of breath and wheezing.   Neurological:  Negative for headaches.  All other systems reviewed and are negative.   History   Past Medical History:  Diagnosis Date   DDD (degenerative disc disease), lumbar    GERD (gastroesophageal reflux disease)    IBS (irritable bowel syndrome)    LEFT shoulder arthroscopy with lysis of adhesions, limited debridement, and subacromial decompression-11/11/2016 11/11/2016   Sleep apnea    CPAP   Sleep apnea 11/11/2016   official diag 2013   Past Surgical History:  Procedure Laterality Date   Shoulder arthroscopy Left 11/11/2016   LEFT shoulder arthroscopy with lysis of adhesions, limited debridement, and subacromial decompression   Shoulder surgery Right    Social History[1] History reviewed. No pertinent family history.  Medication History      Medication Sig Dispense Refill   budesonide-formoterol (SYMBICORT) 160-4.5 mcg/actuation inhaler Inhale two puffs into the lungs 2 (two) times daily. Inhale two puffs twice a day. 10.2 g 2   clidinium-chlordiazePOXIDE (LIBRAX) 2.5-5 mg per capsule Take 1 capsule by mouth 30 (thirty) minutes before meals & at  bedtime.     ibuprofen  (ADVIL ,MOTRIN ) 800 mg tablet Take one tablet (800 mg total) by mouth every 8 (eight) hours as needed for Pain. 45 tablet 1   ipratropium (ATROVENT) 0.03% nasal spray two sprays by Nasal route 2 (two) times daily. 30 mL 5   OMEPRAZOLE PO Take 40 mg by mouth.      traMADol (ULTRAM) 50 mg tablet 1-2 tid prn pain 45 tablet 0   No current facility-administered medications for this visit.   Allergies[2]     I reviewed the patient's medical,surgical,social and family history. The medications and allergies have been reviewed and updated.   Objective  BP 130/90 (BP Location: Right Upper Arm, Patient Position: Sitting)   Pulse 63   Resp 16   Ht 6' 2 (1.88 m)   Wt 253 lb (114.8 kg)   SpO2 96%   BMI 32.48 kg/m   General appearance:  alert, appears stated age, and cooperative Eyes:    pupils are equal, round and reactive Nose:     normal Mouth:    moist, no thrush Neck:    supple, no significant adenopathy, no JVD Chest:    clear to auscultation, no wheezes, rales or rhonchi, symmetric air entry Heart:    normal rate, regular rhythm, normal S1, S2, no murmurs, rubs, clicks or gallops Abdomen:   Non distended  Extremities:   no pitting edema, no clubbing   Pulmonary Function Testing / Spirometry  No results found. Radiology    CXR:  Results for orders placed during the hospital encounter of 01/28/15  XR Chest Pa And Lateral  Narrative INDICATION:Shortness of breath  TECHNIQUE: PA and lateral views of the chest  COMPARISON:None available  FINDINGS:  The lungs are clear. The cardiomediastinal and hilar silhouettes are within normal limits. No pleural effusion or pneumothorax. The visualized osseous structures are grossly unremarkable.  Impression IMPRESSION:No acute disease in the chest    CT Scan: No results found for this or any previous visit.   No results found for this or any previous visit.   No results found for this or any previous  visit.   No results found for this or any previous visit.   No results found for this or any previous visit.   No results found for this or any previous visit.   No results found for this or any previous visit.   No results found for this or any previous visit.   VQ Scan: No results found for this or any previous visit.   TTE:   No results found for this or any previous visit.  Labs   No results found for: GLUCOSE, BUN, CREATININE, EGFR, BUNCREATR, NA, K, CL, CO2, PROTEIN, ALBUMIN, GLOBULIN, AGRATIO, BILITOT, ALKPHOS, AST, ALT  No results found for: WBC, RBC, HGB, HCT, MCV, MCH, MCHC, RDW, PLT, NEUTROS, LYMPHS, MONOS, EOS, BASOS   No orders of the defined types were placed in this encounter.  Immunizations     No immunizations on file.         Patient's Medications       * Accurate as of April 22, 2024 11:46 AM. Reflects encounter med changes as of last refresh          New Prescriptions      Instructions  budesonide-formoterol 160-4.5 mcg/actuation inhaler Commonly known as: SYMBICORT Started by: Hoy Brewster, NP  2 puffs, Inhalation, 2 times a day, Inhale two puffs twice a day.   ipratropium 0.03% nasal spray Commonly known as: ATROVENT Started by: Hoy Brewster, NP  2 sprays, Nasal, 2 times a day       Continued Medications      Instructions  clidinium-chlordiazePOXIDE 2.5-5 mg per capsule Commonly known as: LIBRAX  1 capsule, Oral, 30 minutes before meals & at bedtime   ibuprofen  800 mg tablet Commonly known as: ADVIL ,MOTRIN   800 mg, Oral, Every 8 hours as needed   OMEPRAZOLE PO  40 mg, Oral   traMADol 50 mg tablet Commonly known as: ULTRAM  1-2 tid prn pain        The patient was given a copy of the after visit summary.  Voice-recognition software was used in surveyor, minerals of this documentation. Unintended transcription errors may have escaped editorial  review.   Patient was seen under the supervision of Dr. Javaid.         [1] Social History Socioeconomic History   Marital status: Married  Tobacco Use   Smoking status: Never   Smokeless tobacco: Never  Substance and Sexual Activity   Alcohol use: Yes    Comment: occ   Drug use: No  [2] No Known Allergies *Some images could not be shown.

## 2024-07-11 ENCOUNTER — Emergency Department (HOSPITAL_BASED_OUTPATIENT_CLINIC_OR_DEPARTMENT_OTHER)
Admission: EM | Admit: 2024-07-11 | Discharge: 2024-07-11 | Disposition: A | Discharge status: Home / Self Care | Attending: Emergency Medicine | Admitting: Emergency Medicine

## 2024-07-11 ENCOUNTER — Encounter (HOSPITAL_BASED_OUTPATIENT_CLINIC_OR_DEPARTMENT_OTHER): Payer: Self-pay

## 2024-07-11 ENCOUNTER — Other Ambulatory Visit: Payer: Self-pay

## 2024-07-11 ENCOUNTER — Emergency Department (HOSPITAL_BASED_OUTPATIENT_CLINIC_OR_DEPARTMENT_OTHER): Admitting: Radiology

## 2024-07-11 DIAGNOSIS — J40 Bronchitis, not specified as acute or chronic: Secondary | ICD-10-CM | POA: Insufficient documentation

## 2024-07-11 LAB — CBC
HCT: 47.3 % (ref 39.0–52.0)
Hemoglobin: 16.1 g/dL (ref 13.0–17.0)
MCH: 30.6 pg (ref 26.0–34.0)
MCHC: 34 g/dL (ref 30.0–36.0)
MCV: 89.9 fL (ref 80.0–100.0)
Platelets: 195 K/uL (ref 150–400)
RBC: 5.26 MIL/uL (ref 4.22–5.81)
RDW: 14.7 % (ref 11.5–15.5)
WBC: 6.4 K/uL (ref 4.0–10.5)
nRBC: 0 % (ref 0.0–0.2)

## 2024-07-11 LAB — BASIC METABOLIC PANEL WITH GFR
Anion gap: 11 (ref 5–15)
BUN: 10 mg/dL (ref 6–20)
CO2: 26 mmol/L (ref 22–32)
Calcium: 9.8 mg/dL (ref 8.9–10.3)
Chloride: 102 mmol/L (ref 98–111)
Creatinine, Ser: 1.08 mg/dL (ref 0.61–1.24)
GFR, Estimated: >60 mL/min (ref >60)
Glucose, Bld: 108 mg/dL — ABNORMAL HIGH (ref 70–99)
Potassium: 3.8 mmol/L (ref 3.5–5.1)
Sodium: 139 mmol/L (ref 135–145)

## 2024-07-11 MED ORDER — PREDNISONE 50 MG PO TABS
60.0000 mg | ORAL_TABLET | Freq: Once | ORAL | Status: AC
  Administered 2024-07-11: 20:00:00 60 mg via ORAL
  Filled 2024-07-11: qty 1

## 2024-07-11 MED ORDER — IPRATROPIUM-ALBUTEROL 0.5-2.5 (3) MG/3ML IN SOLN
3.0000 mL | Freq: Once | RESPIRATORY_TRACT | Status: AC
  Administered 2024-07-11: 21:00:00 3 mL via RESPIRATORY_TRACT
  Filled 2024-07-11: qty 3

## 2024-07-11 NOTE — Discharge Instructions (Signed)
 You were seen in the emerged from for shortness of breath He felt better after breathing treatment and steroids We have called in a prescription for steroids for you to pick up in your pharmacy begin taking directed to help with your breathing Follow-up with your pulmonology and primary care doctors Return to the emergency room for trouble breathing or any concerns

## 2024-07-11 NOTE — ED Notes (Signed)
 RT assessed pt at this time. Pt respiratory status stable on RA w/no distress noted at this time. Pt BLBS clr/clr dim w/dry hacking cough. Pt has had a PFT w/in last 6 months that was normal, wears CPAP at night but states he cannot tolerate sometimes so he does not wear it like he should.    07/11/24 1932  Therapy Vitals  Pulse Rate 92  Resp 20  BP (!) 139/101  MEWS Score/Color  MEWS Score 0  MEWS Score Color Green  Respiratory Assessment  Assessment Type Assess only  Respiratory Pattern Regular;Unlabored;Symmetrical  Chest Assessment Chest expansion symmetrical  Cough Strong;Dry;Non-productive  Bilateral Breath Sounds Clear;Diminished  R Upper  Breath Sounds Clear  L Upper Breath Sounds Clear  R Lower Breath Sounds Diminished  L Lower Breath Sounds Clear;Diminished  Oxygen Therapy/Pulse Ox  O2 Device Room Air  SpO2 97 %  O2 Therapy Room air

## 2024-07-11 NOTE — ED Provider Notes (Signed)
 [place] [facility] AT [facility] Provider Note   CSN: [id number] Arrival date & time: 07/11/24  1800     Patient presents with: Asthma   Christopher Massey is a 58 y.o. male.  With reported history of asthma who presents ED for shortness of breath.  Ongoing dry cough shortness of breath hoarse voice intermittently for the last several months.  [facility].  Started on steroid inhaler, albuterol  treatments and nasal Atrovent which he is compliant with.  Recently completed course of prednisone  and states symptoms returned shortly after he was finished with prednisone .  No chest pain nausea vomiting fevers chills    Asthma       Prior to Admission medications  Medication Sig Start Date End Date Taking? Authorizing Provider  predniSONE  (DELTASONE ) 50 MG tablet Take 1 tablet (50 mg total) by mouth daily with breakfast for 4 days. 07/11/24 07/15/24 Yes Pamella Ozell LABOR, DO  allopurinol  (ZYLOPRIM ) 300 MG tablet Take 300 mg by mouth daily. 09/10/22 11/02/23  [provider]  cyanocobalamin  (VITAMIN B12) 500 MCG tablet Take 1 tablet (500 mcg total) by mouth daily. 08/04/23   Elgergawy, Brayton RAMAN, MD  gabapentin  (NEURONTIN ) 300 MG capsule Take 300 mg by mouth daily as needed (for nerve pain).    [provider]  omeprazole (PRILOSEC) 20 MG capsule Take 20 mg by mouth daily as needed (for heartburn).    [provider]    Allergies: Patient has no known allergies.    Review of Systems  Updated Vital Signs BP (!) 143/97   Pulse 89   Temp 98.5 F (36.9 C)   Resp 17   SpO2 100%   Physical Exam Vitals and nursing note reviewed.  HENT:     Head: Normocephalic and atraumatic.  Eyes:     Pupils: Pupils are equal, round, and reactive to light.  Cardiovascular:     Rate and Rhythm: Normal rate and regular rhythm.  Pulmonary:     Effort: Pulmonary effort is normal.     Breath sounds: Normal breath sounds.  Abdominal:      Palpations: Abdomen is soft.     Tenderness: There is no abdominal tenderness.  Skin:    General: Skin is warm and dry.  Neurological:     Mental Status: He is alert.  Psychiatric:        Mood and Affect: Mood normal.     (all labs ordered are listed, but only abnormal results are displayed) Labs Reviewed  BASIC METABOLIC PANEL WITH GFR - Abnormal; Notable for the following components:      Result Value   Glucose, Bld 108 (*)    All other components within normal limits  CBC    EKG: None  Radiology: DG Chest 2 View Result Date: 07/11/2024 EXAM: 2 VIEW(S) XRAY OF THE CHEST 07/11/2024 06:32:00 PM COMPARISON: 12/08/2023 CLINICAL HISTORY: shob FINDINGS: LUNGS AND PLEURA: No focal pulmonary opacity. No pleural effusion. No pneumothorax. HEART AND MEDIASTINUM: No acute abnormality of the cardiac and mediastinal silhouettes. BONES AND SOFT TISSUES: Mild multilevel degenerative changes of thoracic spine. No acute osseous abnormality. IMPRESSION: 1. No acute cardiopulmonary process. Electronically signed by: Greig Pique MD 07/11/2024 06:42 PM EST RP Workstation: [id number]     Procedures   Medications Ordered in the ED  ipratropium-albuterol  (DUONEB) 0.5-2.5 (3) MG/3ML nebulizer solution 3 mL (3 mLs Nebulization Given 07/11/24 2107)  predniSONE  (DELTASONE ) tablet 60 mg (60 mg Oral Given 07/11/24 2024)  Clinical Course as of 07/11/24 2144  Thu Jul 11, 2024  2144 Patient feeling much better after DuoNeb treatment and steroids here.  Will discharge with short course of steroids and instruct for PCP pulmonology follow-up.  Return precautions over worrisome for respiratory distress were discussed in detail [MP]    Clinical Course User Index [MP] Pamella Ozell LABOR, DO                                 Medical Decision Making 58 year old male with history as above presenting to ED for shortness of breath.  Feels as though this is similar to prior episodes suspected to be related to  asthma.  Mostly compliant with CPAP.  Stable on room air.  No appreciable wheezing on my exam but since nebulizer butyryl treatments and steroids of helped in the past we will try this again and see how he feels.  Will obtain chest x-ray and basic laboratory workup to look for evidence of pneumonia  Amount and/or Complexity of Data Reviewed Labs: ordered. Radiology: ordered.  Risk Prescription drug management.        Final diagnoses:  Bronchitis    ED Discharge Orders          Ordered    predniSONE  (DELTASONE ) 50 MG tablet  Daily with breakfast        07/11/24 2013               Pamella Ozell LABOR, DO 07/11/24 2144

## 2024-07-11 NOTE — ED Triage Notes (Signed)
 Pt c/o asthma exac x couple months, seen by [facility] & advised to come to ED for further eval r/t no relief. Advised he's used inhaler but I'm out of times to use it, states he has no neb at home. Voices compliance w home meds
